# Patient Record
Sex: Female | Born: 1950 | Race: White | Hispanic: No | Smoking: Never smoker
Health system: Southern US, Community
[De-identification: ages and names within clinical notes are randomized; demographics above are authoritative.]

## PROBLEM LIST (undated history)

## (undated) DIAGNOSIS — E079 Disorder of thyroid, unspecified: Secondary | ICD-10-CM

## (undated) DIAGNOSIS — I10 Essential (primary) hypertension: Secondary | ICD-10-CM

## (undated) DIAGNOSIS — I639 Cerebral infarction, unspecified: Secondary | ICD-10-CM

## (undated) DIAGNOSIS — J45909 Unspecified asthma, uncomplicated: Secondary | ICD-10-CM

## (undated) DIAGNOSIS — I519 Heart disease, unspecified: Secondary | ICD-10-CM

## (undated) DIAGNOSIS — F32A Depression, unspecified: Secondary | ICD-10-CM

## (undated) DIAGNOSIS — J984 Other disorders of lung: Secondary | ICD-10-CM

## (undated) DIAGNOSIS — K219 Gastro-esophageal reflux disease without esophagitis: Secondary | ICD-10-CM

## (undated) DIAGNOSIS — F329 Major depressive disorder, single episode, unspecified: Secondary | ICD-10-CM

## (undated) HISTORY — DX: Disorder of thyroid, unspecified: E07.9

## (undated) HISTORY — DX: Heart disease, unspecified: I51.9

## (undated) HISTORY — DX: Other disorders of lung: J98.4

## (undated) HISTORY — DX: Cerebral infarction, unspecified: I63.9

## (undated) HISTORY — DX: Unspecified asthma, uncomplicated: J45.909

## (undated) HISTORY — DX: Gastro-esophageal reflux disease without esophagitis: K21.9

## (undated) HISTORY — DX: Essential (primary) hypertension: I10

## (undated) HISTORY — PX: OTHER SURGICAL HISTORY: SHX169

## (undated) HISTORY — DX: Depression, unspecified: F32.A

---
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
## 1998-11-17 ENCOUNTER — Inpatient Hospital Stay (HOSPITAL_COMMUNITY): Admission: EM | Admit: 1998-11-17 | Discharge: 1998-11-21 | Payer: Self-pay | Admitting: Emergency Medicine

## 1998-11-17 ENCOUNTER — Encounter: Payer: Self-pay | Admitting: *Deleted

## 2004-04-18 ENCOUNTER — Ambulatory Visit (HOSPITAL_COMMUNITY): Admission: RE | Admit: 2004-04-18 | Discharge: 2004-04-18 | Payer: Self-pay | Admitting: Cardiology

## 2006-06-19 ENCOUNTER — Inpatient Hospital Stay (HOSPITAL_COMMUNITY): Admission: RE | Admit: 2006-06-19 | Discharge: 2006-06-23 | Payer: Self-pay | Admitting: Urology

## 2010-09-08 ENCOUNTER — Encounter: Payer: Self-pay | Admitting: General Practice

## 2010-09-10 ENCOUNTER — Encounter: Payer: Self-pay | Admitting: General Practice

## 2010-12-25 ENCOUNTER — Other Ambulatory Visit: Payer: Self-pay | Admitting: Physician Assistant

## 2010-12-25 ENCOUNTER — Ambulatory Visit
Admission: RE | Admit: 2010-12-25 | Discharge: 2010-12-25 | Disposition: A | Discharge status: Home / Self Care | Payer: BC Managed Care – PPO | Source: Ambulatory Visit | Attending: Physician Assistant | Admitting: Physician Assistant

## 2010-12-25 DIAGNOSIS — R05 Cough: Secondary | ICD-10-CM

## 2012-02-12 IMAGING — CR DG CHEST 2V
2 series · 2 of 2 positions shown · non-contrast
Comparison: 06/16/2006

CLINICAL DATA: Cough.

CHEST - 2 VIEW

[view not recorded (1 of 2)]
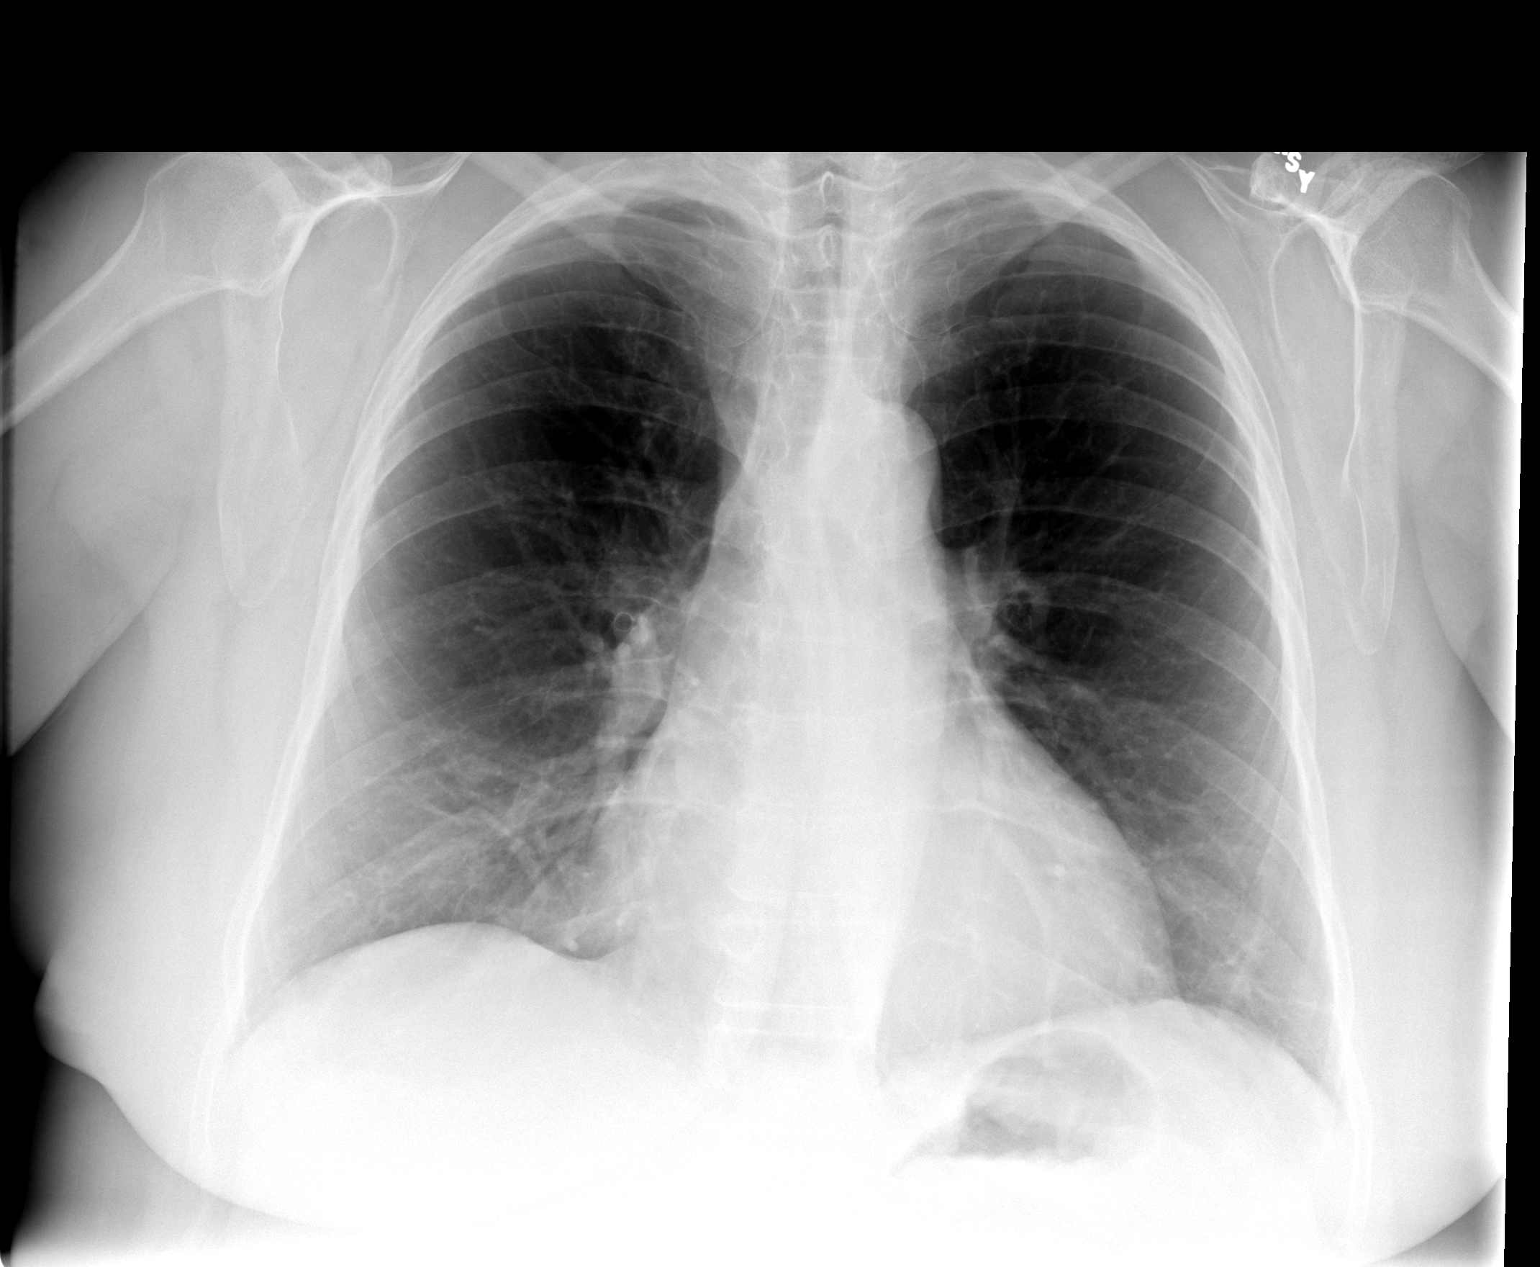

[view not recorded (2 of 2)]
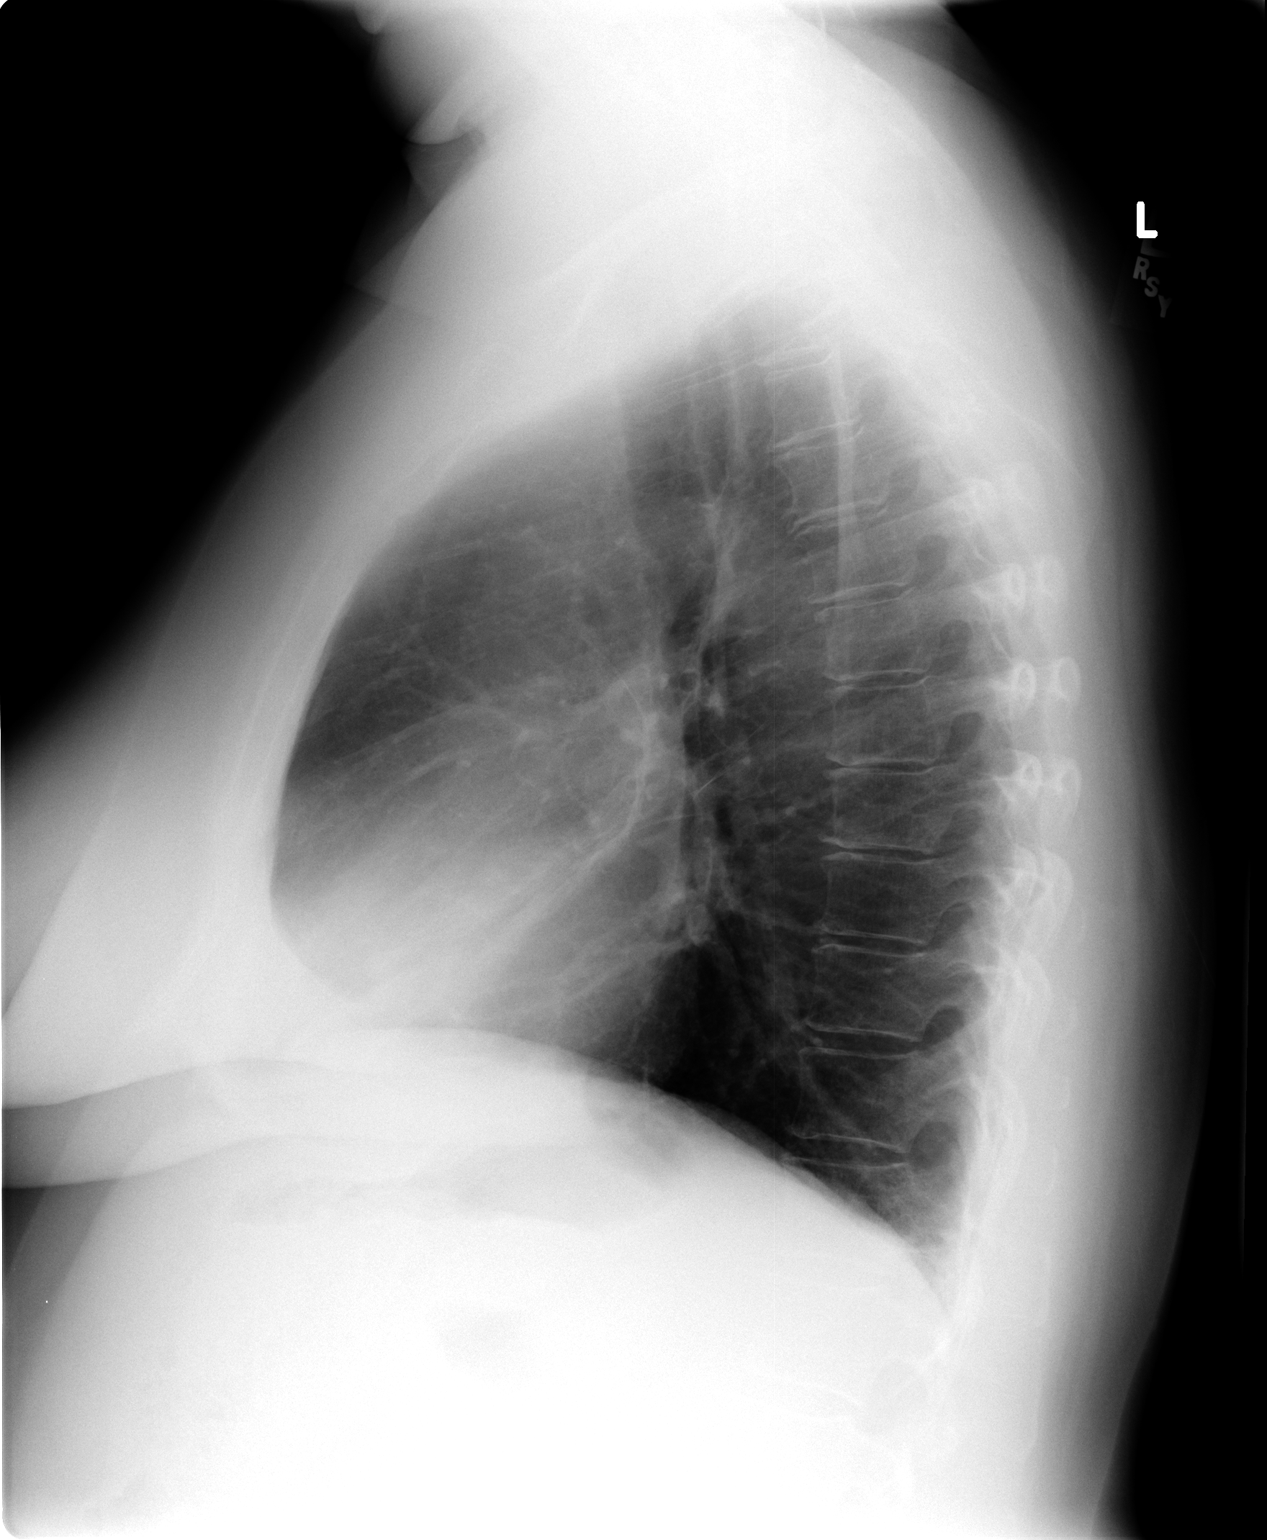

[2 of 2 positions shown; findings below may reference images not displayed]

FINDINGS: Heart and mediastinal contours are within normal limits.
No focal opacities or effusions.  No acute bony abnormality.
IMPRESSION: No active disease.

## 2014-08-19 HISTORY — PX: CARDIAC CATHETERIZATION: SHX172

## 2015-04-19 DIAGNOSIS — I214 Non-ST elevation (NSTEMI) myocardial infarction: Secondary | ICD-10-CM

## 2015-04-19 HISTORY — DX: Non-ST elevation (NSTEMI) myocardial infarction: I21.4

## 2015-05-09 DIAGNOSIS — I5181 Takotsubo syndrome: Secondary | ICD-10-CM | POA: Insufficient documentation

## 2015-05-09 HISTORY — DX: Takotsubo syndrome: I51.81

## 2015-07-05 DIAGNOSIS — I10 Essential (primary) hypertension: Secondary | ICD-10-CM | POA: Insufficient documentation

## 2015-07-05 HISTORY — DX: Essential (primary) hypertension: I10

## 2016-01-08 DIAGNOSIS — E039 Hypothyroidism, unspecified: Secondary | ICD-10-CM | POA: Insufficient documentation

## 2016-01-08 DIAGNOSIS — G4733 Obstructive sleep apnea (adult) (pediatric): Secondary | ICD-10-CM

## 2016-01-08 DIAGNOSIS — I2089 Other forms of angina pectoris: Secondary | ICD-10-CM

## 2016-01-08 DIAGNOSIS — E559 Vitamin D deficiency, unspecified: Secondary | ICD-10-CM | POA: Insufficient documentation

## 2016-01-08 DIAGNOSIS — E782 Mixed hyperlipidemia: Secondary | ICD-10-CM | POA: Insufficient documentation

## 2016-01-08 DIAGNOSIS — I208 Other forms of angina pectoris: Secondary | ICD-10-CM | POA: Insufficient documentation

## 2016-01-08 DIAGNOSIS — I129 Hypertensive chronic kidney disease with stage 1 through stage 4 chronic kidney disease, or unspecified chronic kidney disease: Secondary | ICD-10-CM

## 2016-01-08 DIAGNOSIS — G5603 Carpal tunnel syndrome, bilateral upper limbs: Secondary | ICD-10-CM | POA: Insufficient documentation

## 2016-01-08 DIAGNOSIS — N183 Chronic kidney disease, stage 3 unspecified: Secondary | ICD-10-CM

## 2016-01-08 DIAGNOSIS — J4520 Mild intermittent asthma, uncomplicated: Secondary | ICD-10-CM | POA: Insufficient documentation

## 2016-01-08 DIAGNOSIS — E1122 Type 2 diabetes mellitus with diabetic chronic kidney disease: Secondary | ICD-10-CM | POA: Insufficient documentation

## 2016-01-08 DIAGNOSIS — G43109 Migraine with aura, not intractable, without status migrainosus: Secondary | ICD-10-CM

## 2016-01-08 DIAGNOSIS — M8580 Other specified disorders of bone density and structure, unspecified site: Secondary | ICD-10-CM

## 2016-01-08 DIAGNOSIS — M5412 Radiculopathy, cervical region: Secondary | ICD-10-CM

## 2016-01-08 DIAGNOSIS — F330 Major depressive disorder, recurrent, mild: Secondary | ICD-10-CM | POA: Insufficient documentation

## 2016-01-08 HISTORY — DX: Radiculopathy, cervical region: M54.12

## 2016-01-08 HISTORY — DX: Mild intermittent asthma, uncomplicated: J45.20

## 2016-01-08 HISTORY — DX: Hypertensive chronic kidney disease with stage 1 through stage 4 chronic kidney disease, or unspecified chronic kidney disease: I12.9

## 2016-01-08 HISTORY — DX: Migraine with aura, not intractable, without status migrainosus: G43.109

## 2016-01-08 HISTORY — DX: Obstructive sleep apnea (adult) (pediatric): G47.33

## 2016-01-08 HISTORY — DX: Type 2 diabetes mellitus with diabetic chronic kidney disease: E11.22

## 2016-01-08 HISTORY — DX: Hypothyroidism, unspecified: E03.9

## 2016-01-08 HISTORY — DX: Mixed hyperlipidemia: E78.2

## 2016-01-08 HISTORY — DX: Vitamin D deficiency, unspecified: E55.9

## 2016-01-08 HISTORY — DX: Other forms of angina pectoris: I20.89

## 2016-01-08 HISTORY — DX: Chronic kidney disease, stage 3 unspecified: N18.30

## 2016-01-08 HISTORY — DX: Other specified disorders of bone density and structure, unspecified site: M85.80

## 2016-01-08 HISTORY — DX: Other forms of angina pectoris: I20.8

## 2016-01-08 HISTORY — DX: Carpal tunnel syndrome, bilateral upper limbs: G56.03

## 2016-01-11 DIAGNOSIS — F5101 Primary insomnia: Secondary | ICD-10-CM

## 2016-01-11 DIAGNOSIS — G2581 Restless legs syndrome: Secondary | ICD-10-CM | POA: Insufficient documentation

## 2016-01-11 HISTORY — DX: Primary insomnia: F51.01

## 2016-01-11 HISTORY — DX: Restless legs syndrome: G25.81

## 2018-08-19 HISTORY — PX: INSERTION OF CARDIAC MONITOR: SHX6657

## 2019-09-24 ENCOUNTER — Encounter: Payer: Self-pay | Admitting: Cardiology

## 2019-09-24 ENCOUNTER — Ambulatory Visit: Payer: Medicare HMO | Admitting: Cardiology

## 2019-09-24 ENCOUNTER — Other Ambulatory Visit: Payer: Self-pay

## 2019-09-24 VITALS — BP 130/76 | HR 72 | Ht 62.0 in | Wt 190.0 lb

## 2019-09-24 DIAGNOSIS — I5181 Takotsubo syndrome: Secondary | ICD-10-CM

## 2019-09-24 DIAGNOSIS — I639 Cerebral infarction, unspecified: Secondary | ICD-10-CM

## 2019-09-24 DIAGNOSIS — I10 Essential (primary) hypertension: Secondary | ICD-10-CM

## 2019-09-24 DIAGNOSIS — Z95818 Presence of other cardiac implants and grafts: Secondary | ICD-10-CM | POA: Insufficient documentation

## 2019-09-24 DIAGNOSIS — G2581 Restless legs syndrome: Secondary | ICD-10-CM

## 2019-09-24 DIAGNOSIS — Z9889 Other specified postprocedural states: Secondary | ICD-10-CM

## 2019-09-24 HISTORY — DX: Cerebral infarction, unspecified: I63.9

## 2019-09-24 HISTORY — DX: Presence of other cardiac implants and grafts: Z95.818

## 2019-09-24 NOTE — Patient Instructions (Signed)
Medication Instructions:  Your physician recommends that you continue on your current medications as directed. Please refer to the Current Medication list given to you today.  *If you need a refill on your cardiac medications before your next appointment, please call your pharmacy*  Lab Work: None.  If you have labs (blood work) drawn today and your tests are completely normal, you will receive your results only by: Marland Kitchen MyChart Message (if you have MyChart) OR . A paper copy in the mail If you have any lab test that is abnormal or we need to change your treatment, we will call you to review the results.  Testing/Procedures: None.   Follow-Up: At [facility], you and your health needs are our priority.  As part of our continuing mission to provide you with exceptional heart care, we have created designated Provider Care Teams.  These Care Teams include your primary Cardiologist (physician) and Advanced Practice Providers (APPs -  Physician Assistants and Nurse Practitioners) who all work together to provide you with the care you need, when you need it.  Your next appointment:   6 week(s)  The format for your next appointment:   In Person  Provider:   Gypsy Balsam, MD  Other Instructions   Dr. Bing Matter has referred you to see Dr. Elberta Fortis with electrophysiology for your loop recorder.

## 2019-09-24 NOTE — Progress Notes (Signed)
Cardiology Consultation:    Date:  09/24/2019   ID:  Wendy Valenzuela, DOB March 12, 1951, MRN [id number]  PCP:  Dema Severin, NP  Cardiologist:  Gypsy Balsam, MD   Referring MD: Dema Severin, NP   Chief Complaint  Patient presents with  . New Patient (Initial Visit)    History of Present Illness:    Wendy Valenzuela is a 69 y.o. female who is being seen today for the evaluation of Takotsubo cardiomyopathy at the request of York, Regina F, NP.  She recently relocated from [place].  She is to leave the [place] and she relocated back to [place] because her husband does have a terminal pancreatic cancer and he wants to be back at home in [place].  Apparently she had for cardiac catheterization in her life I do see documentation of cardiac catheterization done in [facility] in 2017 cardiac catheterization showed normal coronaries just diminished left ventricle ejection fraction.  Since that time she got 2 more cardiac catheterization every single time according to her she had normal coronaries.  At the end of last year she ended going to hospital in [place] where she was diagnosed with cryptogenic stroke.  Quite extensive evaluation has been done according to her and nothing has been identified.  He did receive a implantable loop recorder at that time. She would like to be establish as a patient in our practice. She is very stressed out about situation at home and obviously about her husband to be married to get it for 50 years.  He is in hospice already.  She described fatigue tiredness and being exhausted.  There is no swelling of lower extremities there is no proximal nocturnal dyspnea.  There is no palpitations no dizziness no passing out.  Denies having any typical chest pain described to have some sharp stress pinches-like sensation in the left side of her chest lasting only for split-second not related to exercise.  Past Medical History:  Diagnosis Date  .  Asthma   . Depression   . GERD (gastroesophageal reflux disease)   . Heart disease   . Hypertension   . Lung disease    Respiratory Failure  . Stroke (HCC)   . Thyroid disorder     Past Surgical History:  Procedure Laterality Date  . CARDIAC CATHETERIZATION  2016   [facility]   . DG  BONE DENSITY [facility] HX)  2007  . DIAGNOSTIC MAMMOGRAM Bilateral 2004  . INSERTION OF CARDIAC MONITOR  2020   This was done in [place]     Current Medications: Current Meds  Medication Sig  . ADVAIR DISKUS 500-50 MCG/DOSE AEPB Inhale 1 puff into the lungs 2 (two) times daily. Inhale 1 dose by mouth twice daily  . albuterol (VENTOLIN HFA) 108 (90 Base) MCG/ACT inhaler Inhale 2 puffs into the lungs every 6 (six) hours as needed for wheezing or shortness of breath.  Marland Kitchen amLODipine (NORVASC) 2.5 MG tablet Take 2.5 mg by mouth daily. Take 1 tablet once daily.  . clopidogrel (PLAVIX) 75 MG tablet Take 75 mg by mouth daily. Take 1 tablet once daily.  Marland Kitchen escitalopram (LEXAPRO) 20 MG tablet Take 20 mg by mouth daily.  . Ferrous Sulfate (IRON PO) Take 5,000 Units by mouth daily.  . furosemide (LASIX) 20 MG tablet Take 20 mg by mouth daily. Take 1 tablet once daily.  Marland Kitchen gabapentin (NEURONTIN) 300 MG capsule Take 300 mg by mouth daily. Take 1  tablet daily at bedtime.  . hydrochlorothiazide (HYDRODIURIL) 25 MG tablet Take 25 mg by mouth daily. Take 1 tablet once daily.   . isosorbide mononitrate (IMDUR) 60 MG 24 hr tablet Take 60 mg by mouth daily.  Marland Kitchen levothyroxine (SYNTHROID) 50 MCG tablet Take 50 mcg by mouth daily. Take 1 tablet once daily.  Marland Kitchen LOSARTAN POTASSIUM PO Take 100 mg by mouth daily.  . montelukast (SINGULAIR) 10 MG tablet Take 10 mg by mouth daily. Take 1 tablet once daily  . nitroGLYCERIN (NITROSTAT) 0.4 MG SL tablet Place 0.4 mg under the tongue every 5 (five) minutes as needed for chest pain.  Marland Kitchen omeprazole (PRILOSEC) 40 MG capsule Take 40 mg by mouth daily. Take 1 capsule by mouth daily.  Marland Kitchen  rOPINIRole (REQUIP) 1 MG tablet Take 0.25 mg by mouth daily. Take 1 tablet once daily.  . rosuvastatin (CRESTOR) 20 MG tablet Take 20 mg by mouth daily. Take 1 tablet in the evening.  . traZODone (DESYREL) 100 MG tablet Take 100 mg by mouth daily. Take 1-2 tablets as needed at bedtime.  . vitamin B-12 (CYANOCOBALAMIN) 1000 MCG tablet Take 1,000 mcg by mouth daily.  Marland Kitchen vortioxetine HBr (TRINTELLIX) 5 MG TABS tablet Take 5 mg by mouth daily.  . [DISCONTINUED] losartan-hydrochlorothiazide (HYZAAR) 100-25 MG tablet Take 1 tablet by mouth daily.     Allergies:   Aspirin and Sulfa antibiotics   Social History   Socioeconomic History  . Marital status: Married    Spouse name: Not on file  . Number of children: Not on file  . Years of education: Not on file  . Highest education level: Not on file  Occupational History  . Not on file  Tobacco Use  . Smoking status: Never Smoker  . Smokeless tobacco: Never Used  Substance and Sexual Activity  . Alcohol use: Not on file  . Drug use: Not on file  . Sexual activity: Not on file  Other Topics Concern  . Not on file  Social History Narrative  . Not on file   Social Determinants of Health   Financial Resource Strain:   . Difficulty of Paying Living Expenses: Not on file  Food Insecurity:   . Worried About Charity fundraiser in the Last Year: Not on file  . Ran Out of Food in the Last Year: Not on file  Transportation Needs:   . Lack of Transportation (Medical): Not on file  . Lack of Transportation (Non-Medical): Not on file  Physical Activity:   . Days of Exercise per Week: Not on file  . Minutes of Exercise per Session: Not on file  Stress:   . Feeling of Stress : Not on file  Social Connections:   . Frequency of Communication with Friends and Family: Not on file  . Frequency of Social Gatherings with Friends and Family: Not on file  . Attends Religious Services: Not on file  . Active Member of Clubs or Organizations: Not on file   . Attends Archivist Meetings: Not on file  . Marital Status: Not on file     Family History: The patient's family history includes Cancer in her mother; Hypertension in her father; Thyroid disease in her father. ROS:   Please see the history of present illness.    All 14 point review of systems negative except as described per history of present illness.  EKGs/Labs/Other Studies Reviewed:    The following studies were reviewed today: Laboratory test from her primary  care physician were reviewed.  CBC was normal, hemoglobin A1c was 5.7, her liver function tests were normal, lipid panel LDL 160 HDL 40 triglycerides 453 LDL 160.  I also reviewed the medical record from her primary care physician for this visit.  EKG:  EKG is  ordered today.  The ekg ordered today demonstrates EKG showed normal sinus rhythm, incomplete right bundle branch block, left anterior fascicular block, nonspecific ST segment changes  Recent Labs: No results found for requested labs within last 8760 hours.  Recent Lipid Panel No results found for: CHOL, TRIG, HDL, CHOLHDL, VLDL, LDLCALC, LDLDIRECT  Physical Exam:    VS:  BP 130/76   Pulse 72   Ht 5\' 2"  (1.575 m)   Wt 190 lb (86.2 kg)   SpO2 90%   BMI 34.75 kg/m     Wt Readings from Last 3 Encounters:  09/24/19 190 lb (86.2 kg)     GEN:  Well nourished, well developed in no acute distress HEENT: Normal NECK: No JVD; No carotid bruits LYMPHATICS: No lymphadenopathy CARDIAC: RRR, no murmurs, no rubs, no gallops RESPIRATORY:  Clear to auscultation without rales, wheezing or rhonchi  ABDOMEN: Soft, non-tender, non-distended MUSCULOSKELETAL:  No edema; No deformity  SKIN: Warm and dry NEUROLOGIC:  Alert and oriented x 3 PSYCHIATRIC:  Normal affect   ASSESSMENT:    1. History of loop recorder   2. Cryptogenic stroke (HCC)   3. Status post placement of implantable loop recorder   4. Essential hypertension   5. Restless legs syndrome  (RLS)   6. Takotsubo syndrome    PLAN:    In order of problems listed above:  1. History of loop recorder implantation.  I will enroll her in our EP clinic for follow-up. 2. History of cryptogenic stroke.  In my opinion she can benefit from statin.  I will continue conversation with her about starting statin.  She is already on Plavix as well as aspirin I will continue. 3. History of Takotsubo.  I will obtain records from her cardiologist from 11/22/19 she is telling me that she did have echocardiogram done recently. 4. Essential hypertension blood pressure well controlled  We did talk about healthy lifestyle but obviously now when she is taking care of her husband who is critically sick she does not have time to take care of herself.  Therefore, I will see her back in my office in about 6 weeks and we will revisit on this issue at that time we will talk more about taking cholesterol medication as well as healthy lifestyle.   Medication Adjustments/Labs and Tests Ordered: Current medicines are reviewed at length with the patient today.  Concerns regarding medicines are outlined above.  Orders Placed This Encounter  Procedures  . Ambulatory referral to Cardiac Electrophysiology  . EKG 12-Lead   No orders of the defined types were placed in this encounter.   Signed, [place], MD, [facility]. 09/24/2019 4:17 PM    [facility]

## 2019-11-18 ENCOUNTER — Ambulatory Visit: Payer: Medicare HMO | Admitting: Cardiology

## 2019-11-18 ENCOUNTER — Encounter: Payer: Self-pay | Admitting: Cardiology

## 2019-11-18 ENCOUNTER — Other Ambulatory Visit: Payer: Self-pay

## 2019-11-18 VITALS — BP 142/88 | HR 77 | Temp 98.1°F | Ht 62.0 in | Wt 181.4 lb

## 2019-11-18 DIAGNOSIS — I208 Other forms of angina pectoris: Secondary | ICD-10-CM

## 2019-11-18 DIAGNOSIS — I5181 Takotsubo syndrome: Secondary | ICD-10-CM

## 2019-11-18 DIAGNOSIS — I10 Essential (primary) hypertension: Secondary | ICD-10-CM

## 2019-11-18 DIAGNOSIS — I639 Cerebral infarction, unspecified: Secondary | ICD-10-CM | POA: Diagnosis not present

## 2019-11-18 NOTE — Patient Instructions (Signed)
Medication Instructions:  Your physician recommends that you continue on your current medications as directed. Please refer to the Current Medication list given to you today.  *If you need a refill on your cardiac medications before your next appointment, please call your pharmacy*   Lab Work: None ordered  If you have labs (blood work) drawn today and your tests are completely normal, you will receive your results only by: . MyChart Message (if you have MyChart) OR . A paper copy in the mail If you have any lab test that is abnormal or we need to change your treatment, we will call you to review the results.   Testing/Procedures: None ordered    Follow-Up: At [facility], you and your health needs are our priority.  As part of our continuing mission to provide you with exceptional heart care, we have created designated Provider Care Teams.  These Care Teams include your primary Cardiologist (physician) and Advanced Practice Providers (APPs -  Physician Assistants and Nurse Practitioners) who all work together to provide you with the care you need, when you need it.  We recommend signing up for the patient portal called "MyChart".  Sign up information is provided on this After Visit Summary.  MyChart is used to connect with patients for Virtual Visits (Telemedicine).  Patients are able to view lab/test results, encounter notes, upcoming appointments, etc.  Non-urgent messages can be sent to your provider as well.   To learn more about what you can do with MyChart, go to [email or web address].    Your next appointment:   2 month(s)  The format for your next appointment:   In Person  Provider:   Robert Krasowski, MD   Other Instructions None   

## 2019-11-18 NOTE — Progress Notes (Signed)
Cardiology Office Note:    Date:  11/18/2019   ID:  Wendy Valenzuela, DOB [birth year], MRN [id number]  PCP:  Dema Severin, NP  Cardiologist:  Gypsy Balsam, MD    Referring MD: Dema Severin, NP   No chief complaint on file. I am grieving after my husband passing  History of Present Illness:    Wendy Valenzuela is a 69 y.o. female   She recently relocated from [place].  She is to leave the [place] and she relocated back to [place] because her husband does have a terminal pancreatic cancer and he wants to be back at home in [place].  Apparently she had for cardiac catheterization in her life I do see documentation of cardiac catheterization done in [facility] in 2017 cardiac catheterization showed normal coronaries just diminished left ventricle ejection fraction.  Since that time she got 2 more cardiac catheterization every single time according to her she had normal coronaries.  At the end of last year she ended going to hospital in [place] where she was diagnosed with cryptogenic stroke.  Quite extensive evaluation has been done according to her and nothing has been identified.  He did receive a implantable loop recorder at that time. She wanted to be established with our practice consultation.  The reason why she moved back to localize the fact that her husband of 50 years was dying because of pancreatic cancer.  He passed on November 06, 2022.  Obviously she is very sad about it. Cardiac wise seems to be doing well.  Denies having a chest pain, tightness, pressure, burning the chest.  She is asking me if we can reduce any of her medications.  I told her now with this difficult time for her not to do it.  I will see her back in my office in about 2 months and at that time we will decide what to do about her medications.  She will most likely require echocardiogram as well as stress testing.  However, now I would simply follow her up with the present medications.  Past  Medical History:  Diagnosis Date  . Asthma   . Depression   . GERD (gastroesophageal reflux disease)   . Heart disease   . Hypertension   . Lung disease    Respiratory Failure  . Stroke (HCC)   . Thyroid disorder     Past Surgical History:  Procedure Laterality Date  . CARDIAC CATHETERIZATION  2016   [facility]   . DG  BONE DENSITY [facility] HX)  2007  . DIAGNOSTIC MAMMOGRAM Bilateral 2004  . INSERTION OF CARDIAC MONITOR  2020   This was done in [place]     Current Medications: Current Meds  Medication Sig  . ADVAIR DISKUS 500-50 MCG/DOSE AEPB Inhale 1 puff into the lungs 2 (two) times daily. Inhale 1 dose by mouth twice daily  . albuterol (VENTOLIN HFA) 108 (90 Base) MCG/ACT inhaler Inhale 2 puffs into the lungs every 6 (six) hours as needed for wheezing or shortness of breath.  Marland Kitchen amLODipine (NORVASC) 2.5 MG tablet Take 2.5 mg by mouth daily. Take 1 tablet once daily.  Marland Kitchen atorvastatin (LIPITOR) 20 MG tablet Take 20 mg by mouth daily.  . clopidogrel (PLAVIX) 75 MG tablet Take 75 mg by mouth daily. Take 1 tablet once daily.  Marland Kitchen escitalopram (LEXAPRO) 20 MG tablet Take 20 mg by mouth daily.  . Ferrous Sulfate (IRON PO) Take 5,000 Units by mouth  daily.  . furosemide (LASIX) 20 MG tablet Take 20 mg by mouth daily. Take 1 tablet once daily.  Marland Kitchen gabapentin (NEURONTIN) 300 MG capsule Take 300 mg by mouth daily. Take 1 tablet daily at bedtime.  . hydrochlorothiazide (HYDRODIURIL) 25 MG tablet Take 25 mg by mouth daily. Take 1 tablet once daily.   . isosorbide mononitrate (IMDUR) 60 MG 24 hr tablet Take 60 mg by mouth daily.  Marland Kitchen levothyroxine (SYNTHROID) 50 MCG tablet Take 50 mcg by mouth daily. Take 1 tablet once daily.  Marland Kitchen LOSARTAN POTASSIUM PO Take 100 mg by mouth daily.  . montelukast (SINGULAIR) 10 MG tablet Take 10 mg by mouth daily. Take 1 tablet once daily  . nitroGLYCERIN (NITROSTAT) 0.4 MG SL tablet Place 0.4 mg under the tongue every 5 (five) minutes as needed for chest pain.   Marland Kitchen omeprazole (PRILOSEC) 40 MG capsule Take 40 mg by mouth daily. Take 1 capsule by mouth daily.  Marland Kitchen rOPINIRole (REQUIP) 1 MG tablet Take 0.25 mg by mouth daily. Take 1 tablet once daily.  . rosuvastatin (CRESTOR) 20 MG tablet Take 20 mg by mouth daily. Take 1 tablet in the evening.  . temazepam (RESTORIL) 15 MG capsule Take 15 mg by mouth at bedtime.  . traZODone (DESYREL) 100 MG tablet Take 100 mg by mouth daily. Take 1-2 tablets as needed at bedtime.  . vitamin B-12 (CYANOCOBALAMIN) 1000 MCG tablet Take 1,000 mcg by mouth daily.  Marland Kitchen vortioxetine HBr (TRINTELLIX) 5 MG TABS tablet Take 5 mg by mouth daily.     Allergies:   Aspirin and Sulfa antibiotics   Social History   Socioeconomic History  . Marital status: Married    Spouse name: Not on file  . Number of children: Not on file  . Years of education: Not on file  . Highest education level: Not on file  Occupational History  . Not on file  Tobacco Use  . Smoking status: Never Smoker  . Smokeless tobacco: Never Used  Substance and Sexual Activity  . Alcohol use: Not on file  . Drug use: Not on file  . Sexual activity: Not on file  Other Topics Concern  . Not on file  Social History Narrative  . Not on file   Social Determinants of Health   Financial Resource Strain:   . Difficulty of Paying Living Expenses:   Food Insecurity:   . Worried About Programme researcher, broadcasting/film/video in the Last Year:   . Barista in the Last Year:   Transportation Needs:   . Freight forwarder (Medical):   Marland Kitchen Lack of Transportation (Non-Medical):   Physical Activity:   . Days of Exercise per Week:   . Minutes of Exercise per Session:   Stress:   . Feeling of Stress :   Social Connections:   . Frequency of Communication with Friends and Family:   . Frequency of Social Gatherings with Friends and Family:   . Attends Religious Services:   . Active Member of Clubs or Organizations:   . Attends Banker Meetings:   Marland Kitchen Marital  Status:      Family History: The patient's family history includes Cancer in her mother; Hypertension in her father; Thyroid disease in her father. ROS:   Please see the history of present illness.    All 14 point review of systems negative except as described per history of present illness  EKGs/Labs/Other Studies Reviewed:      Recent Labs: No  results found for requested labs within last 8760 hours.  Recent Lipid Panel No results found for: CHOL, TRIG, HDL, CHOLHDL, VLDL, LDLCALC, LDLDIRECT  Physical Exam:    VS:  BP (!) 142/88   Pulse 77   Temp 98.1 F (36.7 C)   Ht 5\' 2"  (1.575 m)   Wt 181 lb 6.4 oz (82.3 kg)   SpO2 97%   BMI 33.18 kg/m     Wt Readings from Last 3 Encounters:  11/18/19 181 lb 6.4 oz (82.3 kg)  09/24/19 190 lb (86.2 kg)     GEN:  Well nourished, well developed in no acute distress HEENT: Normal NECK: No JVD; No carotid bruits LYMPHATICS: No lymphadenopathy CARDIAC: RRR, no murmurs, no rubs, no gallops RESPIRATORY:  Clear to auscultation without rales, wheezing or rhonchi  ABDOMEN: Soft, non-tender, non-distended MUSCULOSKELETAL:  No edema; No deformity  SKIN: Warm and dry LOWER EXTREMITIES: no swelling NEUROLOGIC:  Alert and oriented x 3 PSYCHIATRIC:  Normal affect   ASSESSMENT:    1. Chronic stable angina ([place])   2. Cryptogenic stroke ([place])   3. Essential hypertension   4. Takotsubo syndrome    PLAN:    In order of problems listed above:  1. Chronic stable angina pectoris.  She tells me that every single time she did cardiac catheterization showed normal coronaries.  We did not receive any records from her previous cardiologist.  We will attempt to get records again.  In the meantime we will continue risk factors modifications.  Luckily she is asymptomatic. 2. Cryptogenic stroke.  She does have implantable loop recorder.  She is scheduled to follow-up with EP team to be establish as a patient so can follow-up of her  recorder. 3. Essential hypertension.  Blood pressure seems to be slightly elevated today but again this is a very stressful time for her.  I will wait for about 2 to 3 months before I see her again and adjustment to her medication will be made at that time. 4. Remote history of Takotsubo.  Supposedly normalization of left ventricle ejection fraction after that.  Will schedule her in the future to have an echocardiogram.   Medication Adjustments/Labs and Tests Ordered: Current medicines are reviewed at length with the patient today.  Concerns regarding medicines are outlined above.  No orders of the defined types were placed in this encounter.  Medication changes: No orders of the defined types were placed in this encounter.   Signed, [place], MD, [facility] 11/18/2019 2:53 PM    [place]

## 2019-11-21 NOTE — Progress Notes (Signed)
Electrophysiology Office Note   Date:  11/22/2019   ID:  Wendy Valenzuela, DOB October 10, 1950, MRN [id number]  PCP:  Imagene Riches, NP  Cardiologist:  Agustin Cree Primary Electrophysiologist:  Constance Haw, MD    Chief Complaint: Wendy Valenzuela monitor   History of Present Illness: Wendy Valenzuela is a 69 y.o. female who is being seen today for the evaluation of LINQ monitor at the request of Wendy Liter, MD. Presenting today for electrophysiology evaluation.  She has a history of hypertension, CVA.  She has apparently had multiple catheterizations that have shown no evidence of coronary artery disease.  There is a report of a reduced ejection fraction, but no echo has been performed.  At the end of last year, she went to the hospital in [place] and was diagnosed with cryptogenic stroke.  She had a loop recorder implanted.  She moved to [place] with her husband of 27 years.  He wanted to move back to [place] from [place] as he was diagnosed with pancreatic cancer.  He died on November 25, 2022.  Today, she denies symptoms of palpitations, chest pain, shortness of breath, orthopnea, PND, lower extremity edema, claudication, dizziness, presyncope, syncope, bleeding, or neurologic sequela. The patient is tolerating medications without difficulties.    Past Medical History:  Diagnosis Date  . Asthma   . Depression   . GERD (gastroesophageal reflux disease)   . Heart disease   . Hypertension   . Lung disease    Respiratory Failure  . Stroke (Wendy Valenzuela)   . Thyroid disorder    Past Surgical History:  Procedure Laterality Date  . CARDIAC CATHETERIZATION  2016   [facility]   . DG  BONE DENSITY [facility] HX)  2007  . DIAGNOSTIC MAMMOGRAM Bilateral 2004  . INSERTION OF CARDIAC MONITOR  2020   This was done in [place]      Current Outpatient Medications  Medication Sig Dispense Refill  . ADVAIR DISKUS 500-50 MCG/DOSE AEPB Inhale 1 puff into the lungs 2 (two) times daily. Inhale 1 dose  by mouth twice daily    . albuterol (VENTOLIN HFA) 108 (90 Base) MCG/ACT inhaler Inhale 2 puffs into the lungs every 6 (six) hours as needed for wheezing or shortness of breath.    Marland Kitchen amLODipine (NORVASC) 2.5 MG tablet Take 2.5 mg by mouth daily. Take 1 tablet once daily.    Marland Kitchen atorvastatin (LIPITOR) 20 MG tablet Take 20 mg by mouth daily.    . clopidogrel (PLAVIX) 75 MG tablet Take 75 mg by mouth daily. Take 1 tablet once daily.    Marland Kitchen escitalopram (LEXAPRO) 20 MG tablet Take 20 mg by mouth daily.    . Ferrous Sulfate (IRON PO) Take 5,000 Units by mouth daily.    . furosemide (LASIX) 20 MG tablet Take 20 mg by mouth daily. Take 1 tablet once daily.    Marland Kitchen gabapentin (NEURONTIN) 300 MG capsule Take 300 mg by mouth daily. Take 1 tablet daily at bedtime.    . hydrochlorothiazide (HYDRODIURIL) 25 MG tablet Take 25 mg by mouth daily. Take 1 tablet once daily.     . isosorbide mononitrate (IMDUR) 60 MG 24 hr tablet Take 60 mg by mouth daily.    Marland Kitchen levothyroxine (SYNTHROID) 50 MCG tablet Take 50 mcg by mouth daily. Take 1 tablet once daily.    Marland Kitchen LOSARTAN POTASSIUM PO Take 100 mg by mouth daily.    . montelukast (SINGULAIR) 10 MG tablet Take 10 mg by  mouth daily. Take 1 tablet once daily    . nitroGLYCERIN (NITROSTAT) 0.4 MG SL tablet Place 0.4 mg under the tongue every 5 (five) minutes as needed for chest pain.    Marland Kitchen omeprazole (PRILOSEC) 40 MG capsule Take 40 mg by mouth daily. Take 1 capsule by mouth daily.    Marland Kitchen rOPINIRole (REQUIP) 1 MG tablet Take 0.25 mg by mouth daily. Take 1 tablet once daily.    . rosuvastatin (CRESTOR) 20 MG tablet Take 20 mg by mouth daily. Take 1 tablet in the evening.    . temazepam (RESTORIL) 15 MG capsule Take 15 mg by mouth at bedtime.    . traZODone (DESYREL) 100 MG tablet Take 100 mg by mouth daily. Take 1-2 tablets as needed at bedtime.    . TRINTELLIX 20 MG TABS tablet Take 20 mg by mouth daily.    . vitamin B-12 (CYANOCOBALAMIN) 1000 MCG tablet Take 1,000 mcg by mouth  daily.     No current facility-administered medications for this visit.    Allergies:   Aspirin and Sulfa antibiotics   Social History:  The patient  reports that she has never smoked. She has never used smokeless tobacco.   Family History:  The patient's family history includes Cancer in her mother; Hypertension in her father; Thyroid disease in her father.    ROS:  Please see the history of present illness.   Otherwise, review of systems is positive for none.   All other systems are reviewed and negative.    PHYSICAL EXAM: VS:  BP 130/86   Pulse 70   Ht 5\' 1"  (1.549 m)   Wt 183 lb (83 kg)   SpO2 96%   BMI 34.58 kg/m  , BMI Body mass index is 34.58 kg/m. GEN: Well nourished, well developed, in no acute distress  HEENT: normal  Neck: no JVD, carotid bruits, or masses Cardiac: RRR; no murmurs, rubs, or gallops,no edema  Respiratory:  clear to auscultation bilaterally, normal work of breathing GI: soft, nontender, nondistended, + BS MS: no deformity or atrophy  Skin: warm and dry, device pocket is well healed Neuro:  Strength and sensation are intact Psych: euthymic mood, full affect  EKG:  EKG is not ordered today. Personal review of the ekg ordered 09/24/19 shows SR, LAFB  Device interrogation is reviewed today in detail.  See PaceArt for details.   Recent Labs: No results found for requested labs within last 8760 hours.    Lipid Panel  No results found for: CHOL, TRIG, HDL, CHOLHDL, VLDL, LDLCALC, LDLDIRECT   Wt Readings from Last 3 Encounters:  11/22/19 183 lb (83 kg)  11/18/19 181 lb 6.4 oz (82.3 kg)  09/24/19 190 lb (86.2 kg)      Other studies Reviewed: Additional studies/ records that were reviewed today include: Cardiology notes   ASSESSMENT AND PLAN:  1.  Cryptogenic stroke: Patient is status post Linq monitor implant.  Device functioning appropriately.  No atrial fibrillation seen.  We Wendy Valenzuela have her set up for remote monitoring.  2.   Hypertension: Currently well controlled  Current medicines are reviewed at length with the patient today.   The patient does not have concerns regarding her medicines.  The following changes were made today:  none  Labs/ tests ordered today include:  No orders of the defined types were placed in this encounter.    Disposition:   FU with Armin Yerger pending link monitor results  Signed, Keneth Borg 11/22/19, MD  11/22/2019 10:41  AM     [facility] [address] [place] [address] [phone] (office) [phone] (fax)

## 2019-11-22 ENCOUNTER — Encounter: Payer: Self-pay | Admitting: Cardiology

## 2019-11-22 ENCOUNTER — Ambulatory Visit (INDEPENDENT_AMBULATORY_CARE_PROVIDER_SITE_OTHER): Payer: Medicare HMO | Admitting: Cardiology

## 2019-11-22 ENCOUNTER — Other Ambulatory Visit: Payer: Self-pay

## 2019-11-22 VITALS — BP 130/86 | HR 70 | Ht 61.0 in | Wt 183.0 lb

## 2019-11-22 DIAGNOSIS — Z95818 Presence of other cardiac implants and grafts: Secondary | ICD-10-CM | POA: Diagnosis not present

## 2019-11-22 DIAGNOSIS — I639 Cerebral infarction, unspecified: Secondary | ICD-10-CM | POA: Diagnosis not present

## 2019-11-22 LAB — CUP PACEART INCLINIC DEVICE CHECK
Date Time Interrogation Session: 20210405104137
Implantable Pulse Generator Implant Date: 20201021

## 2019-11-22 NOTE — Patient Instructions (Addendum)
Medication Instructions:  Your physician recommends that you continue on your current medications as directed. Please refer to the Current Medication list given to you today.  *If you need a refill on your cardiac medications before your next appointment, please call your pharmacy*   Lab Work: None ordered   Testing/Procedures: None ordered   Follow-Up: At [facility], you and your health needs are our priority.  As part of our continuing mission to provide you with exceptional heart care, we have created designated Provider Care Teams.  These Care Teams include your primary Cardiologist (physician) and Advanced Practice Providers (APPs -  Physician Assistants and Nurse Practitioners) who all work together to provide you with the care you need, when you need it.  We recommend signing up for the patient portal called "MyChart".  Sign up information is provided on this After Visit Summary.  MyChart is used to connect with patients for Virtual Visits (Telemedicine).  Patients are able to view lab/test results, encounter notes, upcoming appointments, etc.  Non-urgent messages can be sent to your provider as well.   To learn more about what you can do with MyChart, go to [email or web address].    Your next appointment:    No follow up is needed with Dr. Elberta Fortis.  He will see you on an as needed basis.  We will monitor your loop recorder findings monthly.   Thank you for choosing [facility]!!   Dory Horn, RN [phone]    Other Instructions  Device clinic # [phone]

## 2019-11-30 ENCOUNTER — Telehealth: Payer: Self-pay

## 2019-11-30 NOTE — Telephone Encounter (Signed)
LMOVM for pt to send a manual transmission. Per [employer] the pt monitor sent an Alert for a pause episode.

## 2019-12-01 NOTE — Telephone Encounter (Signed)
Transmission received. I told her the nurse will review the transmission and if she see anything she will give her a call back.

## 2019-12-01 NOTE — Telephone Encounter (Signed)
Full report reviewed. All "pause" episodes previously reviewed as false at 11/22/19 clinic visit. Pause detection turned off at that time per Dr. Elberta Fortis. No new alerts or episodes noted.

## 2019-12-10 ENCOUNTER — Ambulatory Visit (INDEPENDENT_AMBULATORY_CARE_PROVIDER_SITE_OTHER): Payer: Medicare HMO | Admitting: *Deleted

## 2019-12-10 DIAGNOSIS — I639 Cerebral infarction, unspecified: Secondary | ICD-10-CM

## 2019-12-10 LAB — CUP PACEART REMOTE DEVICE CHECK
Date Time Interrogation Session: 20210423010822
Implantable Pulse Generator Implant Date: 20201021

## 2019-12-10 NOTE — Progress Notes (Signed)
ILR Remote 

## 2019-12-12 LAB — CUP PACEART REMOTE DEVICE CHECK
Date Time Interrogation Session: 20210424081005
Implantable Pulse Generator Implant Date: 20201021

## 2020-01-05 ENCOUNTER — Telehealth: Payer: Self-pay

## 2020-01-05 NOTE — Telephone Encounter (Signed)
Left message for patient to inform of disconnected monitor. 

## 2020-01-10 LAB — CUP PACEART REMOTE DEVICE CHECK
Date Time Interrogation Session: 20210524013313
Implantable Pulse Generator Implant Date: 20201021

## 2020-01-11 ENCOUNTER — Telehealth: Payer: Self-pay

## 2020-01-11 NOTE — Telephone Encounter (Signed)
LMOVM  ILR alert for AF episode, AF event detail not downloaded due to connection gap. Will need manual for further review of AF event. Routing to get manual transmission for further review. JBox, RN/CVRS

## 2020-01-12 ENCOUNTER — Ambulatory Visit (INDEPENDENT_AMBULATORY_CARE_PROVIDER_SITE_OTHER): Payer: Medicare HMO | Admitting: *Deleted

## 2020-01-12 ENCOUNTER — Other Ambulatory Visit: Payer: Self-pay

## 2020-01-12 DIAGNOSIS — I639 Cerebral infarction, unspecified: Secondary | ICD-10-CM

## 2020-01-12 NOTE — Progress Notes (Signed)
Carelink Summary Report / Loop Recorder 

## 2020-01-18 ENCOUNTER — Ambulatory Visit: Payer: Medicare HMO | Admitting: Cardiology

## 2020-01-18 ENCOUNTER — Other Ambulatory Visit: Payer: Self-pay

## 2020-01-18 ENCOUNTER — Encounter: Payer: Self-pay | Admitting: Cardiology

## 2020-01-18 VITALS — BP 132/86 | HR 67 | Ht 61.0 in | Wt 185.8 lb

## 2020-01-18 DIAGNOSIS — I10 Essential (primary) hypertension: Secondary | ICD-10-CM

## 2020-01-18 DIAGNOSIS — I639 Cerebral infarction, unspecified: Secondary | ICD-10-CM | POA: Diagnosis not present

## 2020-01-18 DIAGNOSIS — E782 Mixed hyperlipidemia: Secondary | ICD-10-CM

## 2020-01-18 DIAGNOSIS — I208 Other forms of angina pectoris: Secondary | ICD-10-CM

## 2020-01-18 NOTE — Progress Notes (Signed)
Cardiology Office Note:    Date:  01/18/2020   ID:  [name], DOB 05-21-1951, MRN [id number]  PCP:  Wendy Severin, NP  Cardiologist:  Gypsy Balsam, MD    Referring MD: Wendy Severin, NP   Chief Complaint  Patient presents with  . Follow-up    2 MO FU   Doing well  History of Present Illness:    Wendy Valenzuela is a 69 y.o. female  She recently relocated from [place]. She is to leave the [place] and she relocated back to [place] because her husband does have a terminal pancreatic cancer and he wants to be back at home in [place]. Apparently she had for cardiac catheterization in her life I do see documentation of cardiac catheterization done in [facility] in 2017 cardiac catheterization showed normal coronaries just diminished left ventricle ejection fraction. Since that time she got 2 more cardiac catheterization every single time according to her she had normal coronaries. At the end of last year she ended going to hospital in [place] where she was diagnosed with cryptogenic stroke. Quite extensive evaluation has been done according to her and nothing has been identified. He did receive a implantable loop recorder at that time. Comes today to my office for follow-up.  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  She wanted me to cut down some of her medication and today's visit this discussion regarding that issue.  Overall she says she is doing well and she described to have some shortness of breath the same as she has been having for last few years.  No chest pain tightness squeezing pressure burning chest.  Past Medical History:  Diagnosis Date  . Asthma   . Depression   . GERD (gastroesophageal reflux disease)   . Heart disease   . Hypertension   . Lung disease    Respiratory Failure  . Stroke (HCC)   . Thyroid disorder     Past Surgical History:   Procedure Laterality Date  . CARDIAC CATHETERIZATION  2016   [facility]   . DG  BONE DENSITY [facility] HX)  2007  . DIAGNOSTIC MAMMOGRAM Bilateral 2004  . INSERTION OF CARDIAC MONITOR  2020   This was done in [place]     Current Medications: Current Meds  Medication Sig  . ADVAIR DISKUS 500-50 MCG/DOSE AEPB Inhale 1 puff into the lungs 2 (two) times daily. Inhale 1 dose by mouth twice daily  . albuterol (VENTOLIN HFA) 108 (90 Base) MCG/ACT inhaler Inhale 2 puffs into the lungs every 6 (six) hours as needed for wheezing or shortness of breath.  Marland Kitchen amLODipine (NORVASC) 2.5 MG tablet Take 2.5 mg by mouth daily. Take 1 tablet once daily.  Marland Kitchen atorvastatin (LIPITOR) 20 MG tablet Take 20 mg by mouth daily.  . clopidogrel (PLAVIX) 75 MG tablet Take 75 mg by mouth daily. Take 1 tablet once daily.  . furosemide (LASIX) 20 MG tablet Take 20 mg by mouth daily. Take 1 tablet once daily.  Marland Kitchen gabapentin (NEURONTIN) 300 MG capsule Take 300 mg by mouth daily. Take 1 tablet daily at bedtime.  . hydrochlorothiazide (HYDRODIURIL) 25 MG tablet Take 25 mg by mouth daily. Take 1 tablet once daily.   . hydrOXYzine (ATARAX/VISTARIL) 25 MG tablet Take 1 tablet by mouth daily.  . isosorbide mononitrate (IMDUR) 60 MG 24 hr tablet Take 60  mg by mouth daily.  Marland Kitchen levothyroxine (SYNTHROID) 50 MCG tablet Take 50 mcg by mouth daily. Take 1 tablet once daily.  Marland Kitchen LOSARTAN POTASSIUM PO Take 100 mg by mouth daily.  . montelukast (SINGULAIR) 10 MG tablet Take 10 mg by mouth daily. Take 1 tablet once daily  . nitroGLYCERIN (NITROSTAT) 0.4 MG SL tablet Place 0.4 mg under the tongue every 5 (five) minutes as needed for chest pain.  Marland Kitchen omeprazole (PRILOSEC) 40 MG capsule Take 40 mg by mouth daily. Take 1 capsule by mouth daily.  . ondansetron (ZOFRAN) 4 MG tablet Take 1 tablet by mouth as needed.  . rosuvastatin (CRESTOR) 20 MG tablet Take 20 mg by mouth daily. Take 1 tablet in the evening.  . temazepam (RESTORIL) 15 MG capsule  Take 15 mg by mouth at bedtime.  . TRINTELLIX 20 MG TABS tablet Take 20 mg by mouth daily.     Allergies:   Aspirin and Sulfa antibiotics   Social History   Socioeconomic History  . Marital status: Married    Spouse name: Not on file  . Number of children: Not on file  . Years of education: Not on file  . Highest education level: Not on file  Occupational History  . Not on file  Tobacco Use  . Smoking status: Never Smoker  . Smokeless tobacco: Never Used  Substance and Sexual Activity  . Alcohol use: Not on file  . Drug use: Not on file  . Sexual activity: Not on file  Other Topics Concern  . Not on file  Social History Narrative  . Not on file   Social Determinants of Health   Financial Resource Strain:   . Difficulty of Paying Living Expenses:   Food Insecurity:   . Worried About Charity fundraiser in the Last Year:   . Arboriculturist in the Last Year:   Transportation Needs:   . Film/video editor (Medical):   Marland Kitchen Lack of Transportation (Non-Medical):   Physical Activity:   . Days of Exercise per Week:   . Minutes of Exercise per Session:   Stress:   . Feeling of Stress :   Social Connections:   . Frequency of Communication with Friends and Family:   . Frequency of Social Gatherings with Friends and Family:   . Attends Religious Services:   . Active Member of Clubs or Organizations:   . Attends Archivist Meetings:   Marland Kitchen Marital Status:      Family History: The patient's family history includes Cancer in her mother; Hypertension in her father; Thyroid disease in her father. ROS:   Please see the history of present illness.    All 14 point review of systems negative except as described per history of present illness  EKGs/Labs/Other Studies Reviewed:      Recent Labs: No results found for requested labs within last 8760 hours.  Recent Lipid Panel No results found for: CHOL, TRIG, HDL, CHOLHDL, VLDL, LDLCALC, LDLDIRECT  Physical Exam:     VS:  BP 132/86   Pulse 67   Ht 5\' 1"  (1.549 m)   Wt 185 lb 12.8 oz (84.3 kg)   SpO2 90%   BMI 35.11 kg/m     Wt Readings from Last 3 Encounters:  01/18/20 185 lb 12.8 oz (84.3 kg)  11/22/19 183 lb (83 kg)  11/18/19 181 lb 6.4 oz (82.3 kg)     GEN:  Well nourished, well developed in no acute distress  HEENT: Normal NECK: No JVD; No carotid bruits LYMPHATICS: No lymphadenopathy CARDIAC: RRR, no murmurs, no rubs, no gallops RESPIRATORY:  Clear to auscultation without rales, wheezing or rhonchi  ABDOMEN: Soft, non-tender, non-distended MUSCULOSKELETAL:  No edema; No deformity  SKIN: Warm and dry LOWER EXTREMITIES: no swelling NEUROLOGIC:  Alert and oriented x 3 PSYCHIATRIC:  Normal affect   ASSESSMENT:    1. Chronic stable angina (HCC)   2. Cryptogenic stroke (HCC)   3. Essential hypertension   4. Mixed hyperlipidemia    PLAN:    In order of problems listed above:  1. Chronic stable angina pectoris managed with medications cardiac catheterization repeatedly showing normal coronaries without obstruction.  Still she got symptomatology suggest coronary artery disease probably we may be dealing with small vessel disease.  Stable 2. Cryptogenic stroke: Implantable loop recorder in place.  So far no arrhythmia identified. 3. Essential hypertension blood pressure well controlled continue present management.  I will ask you to have an echocardiogram done to assess left ventricle ejection fraction.  Based on that we will decide how we can simplify her medical management in the future we may consider discontinuation of amlodipine since she is taking only 2.5 mg as well as discontinuation either hydrochlorothiazide or furosemide. 4. Mixed dyslipidemia: We will call primary care physician to get her fasting blood profile.   Medication Adjustments/Labs and Tests Ordered: Current medicines are reviewed at length with the patient today.  Concerns regarding medicines are outlined above.   No orders of the defined types were placed in this encounter.  Medication changes: No orders of the defined types were placed in this encounter.   Signed, Georgeanna Lea, MD, [facility] 01/18/2020 1:31 PM    [facility]

## 2020-01-18 NOTE — Patient Instructions (Signed)
Medication Instructions:  Your physician recommends that you continue on your current medications as directed. Please refer to the Current Medication list given to you today.  *If you need a refill on your cardiac medications before your next appointment, please call your pharmacy*   Lab Work: None. If you have labs (blood work) drawn today and your tests are completely normal, you will receive your results only by: . MyChart Message (if you have MyChart) OR . A paper copy in the mail If you have any lab test that is abnormal or we need to change your treatment, we will call you to review the results.   Testing/Procedures: Your physician has requested that you have an echocardiogram. Echocardiography is a painless test that uses sound waves to create images of your heart. It provides your doctor with information about the size and shape of your heart and how well your heart's chambers and valves are working. This procedure takes approximately one hour. There are no restrictions for this procedure.     Follow-Up: At [facility], you and your health needs are our priority.  As part of our continuing mission to provide you with exceptional heart care, we have created designated Provider Care Teams.  These Care Teams include your primary Cardiologist (physician) and Advanced Practice Providers (APPs -  Physician Assistants and Nurse Practitioners) who all work together to provide you with the care you need, when you need it.  We recommend signing up for the patient portal called "MyChart".  Sign up information is provided on this After Visit Summary.  MyChart is used to connect with patients for Virtual Visits (Telemedicine).  Patients are able to view lab/test results, encounter notes, upcoming appointments, etc.  Non-urgent messages can be sent to your provider as well.   To learn more about what you can do with MyChart, go to [email or web address].    Your next appointment:   3  month(s)  The format for your next appointment:   In Person  Provider:   Robert Krasowski, MD   Other Instructions   Echocardiogram An echocardiogram is a procedure that uses painless sound waves (ultrasound) to produce an image of the heart. Images from an echocardiogram can provide important information about:  Signs of coronary artery disease (CAD).  Aneurysm detection. An aneurysm is a weak or damaged part of an artery wall that bulges out from the normal force of blood pumping through the body.  Heart size and shape. Changes in the size or shape of the heart can be associated with certain conditions, including heart failure, aneurysm, and CAD.  Heart muscle function.  Heart valve function.  Signs of a past heart attack.  Fluid buildup around the heart.  Thickening of the heart muscle.  A tumor or infectious growth around the heart valves. Tell a health care provider about:  Any allergies you have.  All medicines you are taking, including vitamins, herbs, eye drops, creams, and over-the-counter medicines.  Any blood disorders you have.  Any surgeries you have had.  Any medical conditions you have.  Whether you are pregnant or may be pregnant. What are the risks? Generally, this is a safe procedure. However, problems may occur, including:  Allergic reaction to dye (contrast) that may be used during the procedure. What happens before the procedure? No specific preparation is needed. You may eat and drink normally. What happens during the procedure?   An IV tube may be inserted into one of your veins.  You may receive   contrast through this tube. A contrast is an injection that improves the quality of the pictures from your heart.  A gel will be applied to your chest.  A wand-like tool (transducer) will be moved over your chest. The gel will help to transmit the sound waves from the transducer.  The sound waves will harmlessly bounce off of your heart to  allow the heart images to be captured in real-time motion. The images will be recorded on a computer. The procedure may vary among health care providers and hospitals. What happens after the procedure?  You may return to your normal, everyday life, including diet, activities, and medicines, unless your health care provider tells you not to do that. Summary  An echocardiogram is a procedure that uses painless sound waves (ultrasound) to produce an image of the heart.  Images from an echocardiogram can provide important information about the size and shape of your heart, heart muscle function, heart valve function, and fluid buildup around your heart.  You do not need to do anything to prepare before this procedure. You may eat and drink normally.  After the echocardiogram is completed, you may return to your normal, everyday life, unless your health care provider tells you not to do that. This information is not intended to replace advice given to you by your health care provider. Make sure you discuss any questions you have with your health care provider. Document Revised: 11/26/2018 Document Reviewed: 09/07/2016 Elsevier Patient Education  2020 Elsevier Inc.   

## 2020-01-21 NOTE — Telephone Encounter (Signed)
Transmission received 01/13/2020.

## 2020-02-04 ENCOUNTER — Other Ambulatory Visit: Payer: Medicare HMO

## 2020-02-14 ENCOUNTER — Ambulatory Visit (INDEPENDENT_AMBULATORY_CARE_PROVIDER_SITE_OTHER): Payer: Medicare HMO | Admitting: *Deleted

## 2020-02-14 DIAGNOSIS — I639 Cerebral infarction, unspecified: Secondary | ICD-10-CM | POA: Diagnosis not present

## 2020-02-14 LAB — CUP PACEART REMOTE DEVICE CHECK
Date Time Interrogation Session: 20210628001153
Implantable Pulse Generator Implant Date: 20201021

## 2020-02-15 NOTE — Progress Notes (Signed)
Carelink Summary Report / Loop Recorder 

## 2020-03-20 ENCOUNTER — Ambulatory Visit (INDEPENDENT_AMBULATORY_CARE_PROVIDER_SITE_OTHER): Payer: Medicare HMO | Admitting: *Deleted

## 2020-03-20 DIAGNOSIS — I639 Cerebral infarction, unspecified: Secondary | ICD-10-CM | POA: Diagnosis not present

## 2020-03-21 LAB — CUP PACEART REMOTE DEVICE CHECK
Date Time Interrogation Session: 20210731001529
Implantable Pulse Generator Implant Date: 20201021

## 2020-03-22 NOTE — Progress Notes (Signed)
Carelink Summary Report / Loop Recorder 

## 2020-04-10 ENCOUNTER — Other Ambulatory Visit: Payer: Self-pay

## 2020-04-10 ENCOUNTER — Ambulatory Visit (INDEPENDENT_AMBULATORY_CARE_PROVIDER_SITE_OTHER): Payer: Medicare HMO

## 2020-04-10 DIAGNOSIS — I208 Other forms of angina pectoris: Secondary | ICD-10-CM | POA: Diagnosis not present

## 2020-04-10 LAB — ECHOCARDIOGRAM COMPLETE
Area-P 1/2: 3.91 cm2
P 1/2 time: 585 msec
S' Lateral: 3.2 cm

## 2020-04-10 NOTE — Progress Notes (Signed)
Complete echocardiogram has been performed.  Jimmy Talen Poser RDCS, RVT 

## 2020-04-20 ENCOUNTER — Ambulatory Visit (INDEPENDENT_AMBULATORY_CARE_PROVIDER_SITE_OTHER): Payer: Medicare HMO | Admitting: *Deleted

## 2020-04-20 DIAGNOSIS — I639 Cerebral infarction, unspecified: Secondary | ICD-10-CM

## 2020-04-20 LAB — CUP PACEART REMOTE DEVICE CHECK
Date Time Interrogation Session: 20210902002203
Implantable Pulse Generator Implant Date: 20201021

## 2020-04-25 NOTE — Progress Notes (Signed)
Carelink Summary Report / Loop Recorder 

## 2020-04-27 ENCOUNTER — Ambulatory Visit: Payer: Medicare HMO | Admitting: Cardiology

## 2020-06-05 ENCOUNTER — Ambulatory Visit: Payer: Medicare HMO | Admitting: Cardiology

## 2020-06-05 ENCOUNTER — Encounter: Payer: Self-pay | Admitting: Cardiology

## 2020-06-05 ENCOUNTER — Other Ambulatory Visit: Payer: Self-pay

## 2020-06-05 VITALS — BP 134/88 | HR 60 | Ht 61.0 in | Wt 190.0 lb

## 2020-06-05 DIAGNOSIS — E782 Mixed hyperlipidemia: Secondary | ICD-10-CM

## 2020-06-05 DIAGNOSIS — I208 Other forms of angina pectoris: Secondary | ICD-10-CM

## 2020-06-05 DIAGNOSIS — Z95818 Presence of other cardiac implants and grafts: Secondary | ICD-10-CM | POA: Diagnosis not present

## 2020-06-05 DIAGNOSIS — I639 Cerebral infarction, unspecified: Secondary | ICD-10-CM

## 2020-06-05 DIAGNOSIS — I10 Essential (primary) hypertension: Secondary | ICD-10-CM

## 2020-06-05 NOTE — Patient Instructions (Signed)

## 2020-06-05 NOTE — Progress Notes (Signed)
Cardiology Office Note:    Date:  06/05/2020   ID:  Wendy Valenzuela, DOB June 08, 1951, MRN [id number]  PCP:  Dema Severin, NP  Cardiologist:  Gypsy Balsam, MD    Referring MD: Dema Severin, NP   No chief complaint on file. I am doing fine, and busy working  History of Present Illness:    Wendy Valenzuela is a 69 y.o. female the past medical history significant for angina pectoris, however repeated cardiac catheterization total of 3 showing nonobstructive disease.  Thinking is that we most likely dealing with small vessel disease.  That is being managed successfully with medications.  She described to have rare episode of chest pain.  Overall she is working at [employer], she is very busy, she has to stand all day.  But she is able to manage this.  Denies have any dizziness passing out.  She is getting better with her husband passing.  He died on 11-01-2022.  Past Medical History:  Diagnosis Date  . Asthma   . Depression   . GERD (gastroesophageal reflux disease)   . Heart disease   . Hypertension   . Lung disease    Respiratory Failure  . Stroke (HCC)   . Thyroid disorder     Past Surgical History:  Procedure Laterality Date  . CARDIAC CATHETERIZATION  2016   [facility]   . DG  BONE DENSITY [facility] HX)  2007  . DIAGNOSTIC MAMMOGRAM Bilateral 2004  . INSERTION OF CARDIAC MONITOR  2020   This was done in [place]     Current Medications: Current Meds  Medication Sig  . ADVAIR DISKUS 500-50 MCG/DOSE AEPB Inhale 1 puff into the lungs 2 (two) times daily. Inhale 1 dose by mouth twice daily  . albuterol (VENTOLIN HFA) 108 (90 Base) MCG/ACT inhaler Inhale 2 puffs into the lungs every 6 (six) hours as needed for wheezing or shortness of breath.  Marland Kitchen amLODipine (NORVASC) 2.5 MG tablet Take 2.5 mg by mouth daily. Take 1 tablet once daily.  . clopidogrel (PLAVIX) 75 MG tablet Take 75 mg by mouth daily. Take 1 tablet once daily.  Marland Kitchen gabapentin (NEURONTIN) 300 MG capsule Take 300  mg by mouth daily. Take 1 tablet daily at bedtime.  . hydrochlorothiazide (HYDRODIURIL) 25 MG tablet Take 25 mg by mouth daily. Take 1 tablet once daily.   . hydrOXYzine (ATARAX/VISTARIL) 25 MG tablet Take 1 tablet by mouth daily.  . isosorbide mononitrate (IMDUR) 60 MG 24 hr tablet Take 60 mg by mouth daily.  Marland Kitchen levothyroxine (SYNTHROID) 50 MCG tablet Take 50 mcg by mouth daily. Take 1 tablet once daily.  Marland Kitchen LOSARTAN POTASSIUM PO Take 100 mg by mouth daily.  Marland Kitchen losartan-hydrochlorothiazide (HYZAAR) 100-25 MG tablet Take by mouth daily.  . montelukast (SINGULAIR) 10 MG tablet Take 10 mg by mouth daily. Take 1 tablet once daily  . nitroGLYCERIN (NITROSTAT) 0.4 MG SL tablet Place 0.4 mg under the tongue every 5 (five) minutes as needed for chest pain.  Marland Kitchen omeprazole (PRILOSEC) 40 MG capsule Take 40 mg by mouth daily. Take 1 capsule by mouth daily.  . ondansetron (ZOFRAN) 4 MG tablet Take 1 tablet by mouth as needed.  . temazepam (RESTORIL) 15 MG capsule Take 15 mg by mouth at bedtime.  . TRINTELLIX 20 MG TABS tablet Take 20 mg by mouth daily.  . vitamin B-12 (CYANOCOBALAMIN) 1000 MCG tablet Take 1,000 mcg by mouth daily.     Allergies:  Aspirin and Sulfa antibiotics   Social History   Socioeconomic History  . Marital status: Married    Spouse name: Not on file  . Number of children: Not on file  . Years of education: Not on file  . Highest education level: Not on file  Occupational History  . Not on file  Tobacco Use  . Smoking status: Never Smoker  . Smokeless tobacco: Never Used  Vaping Use  . Vaping Use: Never used  Substance and Sexual Activity  . Alcohol use: Not on file  . Drug use: Not on file  . Sexual activity: Not on file  Other Topics Concern  . Not on file  Social History Narrative  . Not on file   Social Determinants of Health   Financial Resource Strain:   . Difficulty of Paying Living Expenses: Not on file  Food Insecurity:   . Worried About Brewing technologist in the Last Year: Not on file  . Ran Out of Food in the Last Year: Not on file  Transportation Needs:   . Lack of Transportation (Medical): Not on file  . Lack of Transportation (Non-Medical): Not on file  Physical Activity:   . Days of Exercise per Week: Not on file  . Minutes of Exercise per Session: Not on file  Stress:   . Feeling of Stress : Not on file  Social Connections:   . Frequency of Communication with Friends and Family: Not on file  . Frequency of Social Gatherings with Friends and Family: Not on file  . Attends Religious Services: Not on file  . Active Member of Clubs or Organizations: Not on file  . Attends Banker Meetings: Not on file  . Marital Status: Not on file     Family History: The patient's family history includes Cancer in her mother; Hypertension in her father; Thyroid disease in her father. ROS:   Please see the history of present illness.    All 14 point review of systems negative except as described per history of present illness  EKGs/Labs/Other Studies Reviewed:      Recent Labs: No results found for requested labs within last 8760 hours.  Recent Lipid Panel No results found for: CHOL, TRIG, HDL, CHOLHDL, VLDL, LDLCALC, LDLDIRECT  Physical Exam:    VS:  BP 134/88   Pulse 60   Ht 5\' 1"  (1.549 m)   Wt 190 lb (86.2 kg)   SpO2 97%   BMI 35.90 kg/m     Wt Readings from Last 3 Encounters:  06/05/20 190 lb (86.2 kg)  01/18/20 185 lb 12.8 oz (84.3 kg)  11/22/19 183 lb (83 kg)     GEN:  Well nourished, well developed in no acute distress HEENT: Normal NECK: No JVD; No carotid bruits LYMPHATICS: No lymphadenopathy CARDIAC: RRR, no murmurs, no rubs, no gallops RESPIRATORY:  Clear to auscultation without rales, wheezing or rhonchi  ABDOMEN: Soft, non-tender, non-distended MUSCULOSKELETAL:  No edema; No deformity  SKIN: Warm and dry LOWER EXTREMITIES: no swelling NEUROLOGIC:  Alert and oriented x 3 PSYCHIATRIC:  Normal  affect   ASSESSMENT:    1. Chronic stable angina (HCC)   2. Cryptogenic stroke (HCC)   3. Essential hypertension   4. Status post placement of implantable loop recorder   5. Mixed hyperlipidemia    PLAN:    In order of problems listed above:  1. Chronic stable angina pectoris with very episode of chest pain.  Doing well from that  point review on appropriate medications. 2. History of cryptogenic stroke, interrogation of her implantable loop recorder reviewed.  There was some documentation of atrial fibrillation but it was recorded by arrow.  She did not have any atrial fibrillation. 3. Essential hypertension blood pressure well controlled continue present management. 4. Mixed dyslipidemia: I do not have recent data from her primary care physician: I will call primary care physician office to get report of her cholesterol. 5. We did talk about healthy lifestyle need to exercise on the regular basis which she does not do, but she is busy working.   Medication Adjustments/Labs and Tests Ordered: Current medicines are reviewed at length with the patient today.  Concerns regarding medicines are outlined above.  No orders of the defined types were placed in this encounter.  Medication changes: No orders of the defined types were placed in this encounter.   Signed, Georgeanna Lea, MD, [facility] 06/05/2020 9:36 AM    [facility]

## 2020-06-09 ENCOUNTER — Telehealth: Payer: Self-pay

## 2020-06-09 NOTE — Telephone Encounter (Signed)
Carelink alert received- AF no details avail.  Need manual transmission for EGMs Attempted to reach pt for manual transmission.  No answer/ no DPR on file.  Left generic message on VM requesting callback.

## 2020-06-14 NOTE — Telephone Encounter (Signed)
Attempted to call patient.   No answer, LMOVM (generic).

## 2020-06-21 NOTE — Telephone Encounter (Signed)
  Attempted to call in regards to previous note.  No answer, LMOVM.

## 2020-06-23 NOTE — Telephone Encounter (Signed)
LMOM to call DC , DC # provided.

## 2020-06-25 LAB — CUP PACEART REMOTE DEVICE CHECK
Date Time Interrogation Session: 20211107005038
Implantable Pulse Generator Implant Date: 20201021

## 2020-06-26 ENCOUNTER — Ambulatory Visit (INDEPENDENT_AMBULATORY_CARE_PROVIDER_SITE_OTHER): Payer: Medicare HMO

## 2020-06-26 DIAGNOSIS — I639 Cerebral infarction, unspecified: Secondary | ICD-10-CM

## 2020-06-26 NOTE — Progress Notes (Signed)
Carelink Summary Report / Loop Recorder 

## 2020-07-07 NOTE — Telephone Encounter (Signed)
Monthly summary received/ reviewed by MD, no changes.

## 2020-07-31 ENCOUNTER — Ambulatory Visit (INDEPENDENT_AMBULATORY_CARE_PROVIDER_SITE_OTHER): Payer: Medicare HMO

## 2020-07-31 DIAGNOSIS — I639 Cerebral infarction, unspecified: Secondary | ICD-10-CM | POA: Diagnosis not present

## 2020-08-01 LAB — CUP PACEART REMOTE DEVICE CHECK
Date Time Interrogation Session: 20211210014514
Implantable Pulse Generator Implant Date: 20201021

## 2020-08-14 NOTE — Progress Notes (Signed)
Carelink Summary Report / Loop Recorder 

## 2020-09-04 ENCOUNTER — Ambulatory Visit (INDEPENDENT_AMBULATORY_CARE_PROVIDER_SITE_OTHER): Payer: Medicare HMO

## 2020-09-04 DIAGNOSIS — I639 Cerebral infarction, unspecified: Secondary | ICD-10-CM

## 2020-09-06 LAB — CUP PACEART REMOTE DEVICE CHECK
Date Time Interrogation Session: 20220112030131
Implantable Pulse Generator Implant Date: 20201021

## 2020-09-18 NOTE — Progress Notes (Signed)
Carelink Summary Report / Loop Recorder 

## 2020-10-30 ENCOUNTER — Other Ambulatory Visit: Payer: Self-pay

## 2020-10-30 DIAGNOSIS — J984 Other disorders of lung: Secondary | ICD-10-CM | POA: Insufficient documentation

## 2020-10-30 DIAGNOSIS — J45909 Unspecified asthma, uncomplicated: Secondary | ICD-10-CM | POA: Insufficient documentation

## 2020-10-30 DIAGNOSIS — I639 Cerebral infarction, unspecified: Secondary | ICD-10-CM | POA: Insufficient documentation

## 2020-10-30 DIAGNOSIS — I519 Heart disease, unspecified: Secondary | ICD-10-CM | POA: Insufficient documentation

## 2020-10-30 DIAGNOSIS — E079 Disorder of thyroid, unspecified: Secondary | ICD-10-CM | POA: Insufficient documentation

## 2020-10-30 DIAGNOSIS — I10 Essential (primary) hypertension: Secondary | ICD-10-CM | POA: Insufficient documentation

## 2020-10-30 DIAGNOSIS — K219 Gastro-esophageal reflux disease without esophagitis: Secondary | ICD-10-CM | POA: Insufficient documentation

## 2020-10-30 DIAGNOSIS — F32A Depression, unspecified: Secondary | ICD-10-CM | POA: Insufficient documentation

## 2020-11-03 ENCOUNTER — Ambulatory Visit (INDEPENDENT_AMBULATORY_CARE_PROVIDER_SITE_OTHER): Payer: Medicare HMO | Admitting: Cardiology

## 2020-11-03 ENCOUNTER — Other Ambulatory Visit: Payer: Self-pay

## 2020-11-03 ENCOUNTER — Encounter: Payer: Self-pay | Admitting: Cardiology

## 2020-11-03 VITALS — BP 126/80 | HR 74 | Ht 61.0 in | Wt 190.0 lb

## 2020-11-03 DIAGNOSIS — N183 Chronic kidney disease, stage 3 unspecified: Secondary | ICD-10-CM

## 2020-11-03 DIAGNOSIS — I6930 Unspecified sequelae of cerebral infarction: Secondary | ICD-10-CM | POA: Diagnosis not present

## 2020-11-03 DIAGNOSIS — E1122 Type 2 diabetes mellitus with diabetic chronic kidney disease: Secondary | ICD-10-CM | POA: Diagnosis not present

## 2020-11-03 DIAGNOSIS — Z95818 Presence of other cardiac implants and grafts: Secondary | ICD-10-CM | POA: Diagnosis not present

## 2020-11-03 DIAGNOSIS — I129 Hypertensive chronic kidney disease with stage 1 through stage 4 chronic kidney disease, or unspecified chronic kidney disease: Secondary | ICD-10-CM

## 2020-11-03 DIAGNOSIS — I10 Essential (primary) hypertension: Secondary | ICD-10-CM | POA: Diagnosis not present

## 2020-11-03 DIAGNOSIS — E782 Mixed hyperlipidemia: Secondary | ICD-10-CM

## 2020-11-03 NOTE — Progress Notes (Signed)
Cardiology Office Note:    Date:  11/03/2020   ID:  Wendy Valenzuela, DOB February 03, 1951, MRN [id number]  PCP:  Dema Severin, NP  Cardiologist:  Gypsy Balsam, MD    Referring MD: Dema Severin, NP   No chief complaint on file. Cardiac wise doing fine but cannot sleep  History of Present Illness:    Wendy Valenzuela is a 70 y.o. female with cryptogenic stroke essential hypertension, dyslipidemia.  Comes today 2 months of follow-up biggest complaint she has is difficulty sleeping this is a chronic problem she says she have difficulty falling asleep she still think a lot of about things and she simply cannot relax.  She tried some melatonin some trazodone with limited success.  Denies have any palpitation chest pain tightness squeezing pressure burning chest no shortness of breath.  She said that typically during the wintertime she slows down and she does not do much but in the springtime and summer she loves gardening and she does a lot.  She is looking forward to growing season.  Past Medical History:  Diagnosis Date  . Acquired hypothyroidism 01/08/2016  . Asthma   . Carpal tunnel syndrome, bilateral upper limbs 01/08/2016  . Cervical radiculopathy 01/08/2016  . Chronic stable angina (HCC) 01/08/2016  . Cryptogenic stroke (HCC) 09/24/2019  . Depression   . Essential hypertension 07/05/2015  . GERD (gastroesophageal reflux disease)   . Heart disease   . Hypertension   . Lung disease    Respiratory Failure  . Migraine with aura and without status migrainosus 01/08/2016  . Mild intermittent asthma without complication 01/08/2016  . Mixed hyperlipidemia 01/08/2016  . NSTEMI (non-ST elevated myocardial infarction) (HCC) 04/19/2015  . OSA (obstructive sleep apnea) 01/08/2016  . Osteopenia 01/08/2016  . Primary insomnia 01/11/2016  . Restless legs syndrome (RLS) 01/11/2016  . Status post placement of implantable loop recorder 09/24/2019  . Stroke (HCC)   . Takotsubo syndrome 05/09/2015  . Thyroid  disorder   . 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  . Vitamin D deficiency disease 01/08/2016    Past Surgical History:  Procedure Laterality Date  . CARDIAC CATHETERIZATION  2016   [facility]   . DG  BONE DENSITY [facility] HX)  2007  . DIAGNOSTIC MAMMOGRAM Bilateral 2004  . INSERTION OF CARDIAC MONITOR  2020   This was done in [place]     Current Medications: No outpatient medications have been marked as taking for the 11/03/20 encounter (Appointment) with Georgeanna Lea, MD.     Allergies:   Aspirin and Sulfa antibiotics   Social History   Socioeconomic History  . Marital status: Married    Spouse name: Not on file  . Number of children: Not on file  . Years of education: Not on file  . Highest education level: Not on file  Occupational History  . Not on file  Tobacco Use  . Smoking status: Never Smoker  . Smokeless tobacco: Never Used  Vaping Use  . Vaping Use: Never used  Substance and Sexual Activity  . Alcohol use: Not on file  . Drug use: Not on file  . Sexual activity: Not on file  Other Topics Concern  . Not on file  Social History Narrative  . Not on file   Social Determinants of Health   Financial Resource Strain: Not on file  Food Insecurity: Not on file  Transportation Needs: Not on file  Physical Activity: Not on  file  Stress: Not on file  Social Connections: Not on file     Family History: The patient's family history includes Cancer in her mother; Hypertension in her father; Thyroid disease in her father. ROS:   Please see the history of present illness.    All 14 point review of systems negative except as described per history of present illness  EKGs/Labs/Other Studies Reviewed:      Recent Labs: No results found for requested labs within last 8760 hours.  Recent Lipid Panel No results found for: CHOL, TRIG, HDL, CHOLHDL, VLDL, LDLCALC, LDLDIRECT  Physical Exam:    VS:  There were no vitals taken for  this visit.    Wt Readings from Last 3 Encounters:  06/05/20 190 lb (86.2 kg)  01/18/20 185 lb 12.8 oz (84.3 kg)  11/22/19 183 lb (83 kg)     GEN:  Well nourished, well developed in no acute distress HEENT: Normal NECK: No JVD; No carotid bruits LYMPHATICS: No lymphadenopathy CARDIAC: RRR, no murmurs, no rubs, no gallops RESPIRATORY:  Clear to auscultation without rales, wheezing or rhonchi  ABDOMEN: Soft, non-tender, non-distended MUSCULOSKELETAL:  No edema; No deformity  SKIN: Warm and dry LOWER EXTREMITIES: no swelling NEUROLOGIC:  Alert and oriented x 3 PSYCHIATRIC:  Normal affect   ASSESSMENT:    1. Status post placement of implantable loop recorder   2. Essential hypertension   3. Type 2 DM with CKD stage 3 and hypertension (HCC)   4. Late effect of cerebrovascular accident (CVA)   5. Mixed hyperlipidemia    PLAN:    In order of problems listed above:  1. Status post placement of implantable loop recorder, I did review interrogation no episodes of atrial fibrillation.  She was asking a lot of question about the device she said why do I still needed I explained to her really hoping that she will never have episode of atrial fibrillation but if she does have 1 we would like to pick it up on the device and then change her management. 2. Essential hypertension blood pressure well controlled continue present management. 3. Type 2 diabetes that being followed by internal medicine team according to patient stable. 4. Mixed dyslipidemia I do not have any of her fasting lipid profile, she said primary care physician did it apparently excellent.  We will call to get report of it.   Medication Adjustments/Labs and Tests Ordered: Current medicines are reviewed at length with the patient today.  Concerns regarding medicines are outlined above.  No orders of the defined types were placed in this encounter.  Medication changes: No orders of the defined types were placed in this  encounter.   Signed, Georgeanna Lea, MD, [facility] 11/03/2020 12:47 PM    [facility]

## 2020-11-03 NOTE — Patient Instructions (Signed)

## 2020-11-12 LAB — CUP PACEART REMOTE DEVICE CHECK
Date Time Interrogation Session: 20220319043015
Implantable Pulse Generator Implant Date: 20201021

## 2020-11-13 ENCOUNTER — Ambulatory Visit (INDEPENDENT_AMBULATORY_CARE_PROVIDER_SITE_OTHER): Payer: Medicare HMO

## 2020-11-13 DIAGNOSIS — I639 Cerebral infarction, unspecified: Secondary | ICD-10-CM

## 2020-11-27 NOTE — Progress Notes (Signed)
Carelink Summary Report / Loop Recorder 

## 2021-05-10 DIAGNOSIS — R079 Chest pain, unspecified: Secondary | ICD-10-CM | POA: Diagnosis not present

## 2021-07-05 ENCOUNTER — Telehealth: Payer: Self-pay | Admitting: Cardiology

## 2021-07-05 NOTE — Telephone Encounter (Signed)
   [facility] Pre-operative Risk Assessment    Patient Name: Wendy Valenzuela  DOB: [birth year] MRN: [id number]  HEARTCARE STAFF:  - IMPORTANT!!!!!! Under Visit Info/Reason for Call, type in Other and utilize the format Clearance MM/DD/YY or Clearance TBD. Do not use dashes or single digits. - Please review there is not already an duplicate clearance open for this procedure. - If request is for dental extraction, please clarify the # of teeth to be extracted. - If the patient is currently at the dentist's office, call Pre-Op Callback Staff (MA/nurse) to input urgent request.  - If the patient is not currently in the dentist office, please route to the Pre-Op pool.  Request for surgical clearance:  What type of surgery is being performed? EGD dilation, and colonoscopy  When is this surgery scheduled? 07/10/21  What type of clearance is required (medical clearance vs. Pharmacy clearance to hold med vs. Both)? Pharmacy  Are there any medications that need to be held prior to surgery and how long? Will need to hold plavix starting today  Practice name and name of physician performing surgery? [facility], Dr. Lyda Jester  What is the office phone number? [phone]   7.   What is the office fax number? [phone]  8.   Anesthesia type (None, local, MAC, general) ? propofol   Selena Zobro 07/05/2021, 8:31 AM  _________________________________________________________________   (provider comments below)

## 2021-07-05 NOTE — Telephone Encounter (Signed)
   Primary Cardiologist: Gypsy Balsam, MD  Chart reviewed as part of pre-operative protocol coverage. Given past medical history and time since last visit, based on ACC/AHA guidelines, Wendy Valenzuela would be at acceptable risk for the planned procedure without further cardiovascular testing.   Her Plavix may be held for 5-7 days prior to her procedure if necessary.  Please resume as soon as hemostasis is achieved.  Patient was advised that if she develops new symptoms prior to surgery to contact our office to arrange a follow-up appointment.  She verbalized understanding.  I will route this recommendation to the requesting party via Epic fax function and remove from pre-op pool.  Please call with questions.  Thomasene Ripple. Coco Sharpnack NP-C    07/05/2021, 12:37 PM [facility] [address] Office [phone] Fax [phone]

## 2021-07-05 NOTE — Telephone Encounter (Signed)
Loni Muse. Lipscomb 70 year old female is requesting urgent cardiac evaluation for EGD dilation and colonoscopy.  Procedure scheduled for 07/10/2021.  She was last seen in the clinic on 11/03/2020.  During that time her main complaint was with difficulty sleeping.  She reported that she would typically slow down during the winter and increase her physical activity in the springtime and summer.  She enjoyed gardening.  She denied palpitations, chest pain, tightness, squeezing, pressure, and shortness of breath.  Her PMH includes nonobstructive CAD per cardiac catheterization (felt to be small vessel disease), asthma, depression, CAD, HTN, cryptogenic stroke, status post loop recorder implant, and thyroid disorder.  May her Plavix be held prior to her procedure?   Thank you for your help.  Please direct your response to CV DIV preop pool.  Thomasene Ripple. Heru Montz NP-C    07/05/2021, 9:09 AM [facility] [address] Office [phone] Fax [phone]

## 2021-12-07 ENCOUNTER — Encounter: Payer: Self-pay | Admitting: Cardiology

## 2021-12-07 ENCOUNTER — Ambulatory Visit: Payer: Medicare HMO | Admitting: Cardiology

## 2021-12-07 VITALS — BP 128/88 | HR 65 | Ht 61.0 in | Wt 187.4 lb

## 2021-12-07 DIAGNOSIS — I208 Other forms of angina pectoris: Secondary | ICD-10-CM

## 2021-12-07 DIAGNOSIS — E1122 Type 2 diabetes mellitus with diabetic chronic kidney disease: Secondary | ICD-10-CM

## 2021-12-07 DIAGNOSIS — I2089 Other forms of angina pectoris: Secondary | ICD-10-CM

## 2021-12-07 DIAGNOSIS — I10 Essential (primary) hypertension: Secondary | ICD-10-CM | POA: Diagnosis not present

## 2021-12-07 DIAGNOSIS — I129 Hypertensive chronic kidney disease with stage 1 through stage 4 chronic kidney disease, or unspecified chronic kidney disease: Secondary | ICD-10-CM

## 2021-12-07 DIAGNOSIS — I639 Cerebral infarction, unspecified: Secondary | ICD-10-CM | POA: Diagnosis not present

## 2021-12-07 DIAGNOSIS — N183 Chronic kidney disease, stage 3 unspecified: Secondary | ICD-10-CM

## 2021-12-07 DIAGNOSIS — G4733 Obstructive sleep apnea (adult) (pediatric): Secondary | ICD-10-CM

## 2021-12-07 DIAGNOSIS — I5181 Takotsubo syndrome: Secondary | ICD-10-CM

## 2021-12-07 MED ORDER — CLONIDINE HCL 0.1 MG PO TABS: ORAL_TABLET | ORAL | 3 refills | Status: DC

## 2021-12-07 NOTE — Addendum Note (Signed)
Addended by: Baldo Ash D on: 12/07/2021 02:08 PM ? ? Modules accepted: Orders ? ?

## 2021-12-07 NOTE — Patient Instructions (Addendum)
Medication Instructions:  ?Your physician has recommended you make the following change in your medication:  ? ?START: Catapres 0.1mg  1 daily by mouth for systolic blood pressure of 180 or greater.  ? ? ?Lab Work: ?None Ordered ?If you have labs (blood work) drawn today and your tests are completely normal, you will receive your results only by: ?MyChart Message (if you have MyChart) OR ?A paper copy in the mail ?If you have any lab test that is abnormal or we need to change your treatment, we will call you to review the results. ? ? ?Testing/Procedures: ?None Ordered ? ? ?Follow-Up: ?At [facility], you and your health needs are our priority.  As part of our continuing mission to provide you with exceptional heart care, we have created designated Provider Care Teams.  These Care Teams include your primary Cardiologist (physician) and Advanced Practice Providers (APPs -  Physician Assistants and Nurse Practitioners) who all work together to provide you with the care you need, when you need it. ? ?We recommend signing up for the patient portal called "MyChart".  Sign up information is provided on this After Visit Summary.  MyChart is used to connect with patients for Virtual Visits (Telemedicine).  Patients are able to view lab/test results, encounter notes, upcoming appointments, etc.  Non-urgent messages can be sent to your provider as well.   ?To learn more about what you can do with MyChart, go to [email or web address].   ? ?Your next appointment:   ?6 month(s) ? ?The format for your next appointment:   ?In Person ? ?Provider:   ?Jenne Campus, MD  ? ? ?Other Instructions ?NA  ?

## 2021-12-07 NOTE — Progress Notes (Signed)
?Cardiology Office Note:   ? ?Date:  12/07/2021  ? ?ID:  Tim Lair, DOB 06-24-1951, MRN [id number] ? ?PCP:  Imagene Riches, NP  ?Cardiologist:  Jenne Campus, MD   ? ?Referring MD: Imagene Riches, NP  ? ?Chief Complaint  ?Patient presents with  ? Hypertension  ? ? ?History of Present Illness:   ? ?Wendy Valenzuela is a 71 y.o. female with past medical history significant for cryptogenic CVA, essential hypertension, dyslipidemia.  She does have implantable loop recorder which did not record any episodes of atrial fibrillation.  She requested to be seen in our office since she end up being in the hospital because of high blood pressure she did not feel well check her blood pressure was [id number] systolic she eventually end up going to the emergency room she waited in the waiting room for about 3 hours decided to take double doses of her blood pressure medication since that time things subsided she was given 3 Tylenols in the emergency room and discharged home.  Since that time she seems to be doing well.  She denies have any chest pain tightness squeezing pressure burning chest no palpitations dizziness swelling of lower extremities ? ?Past Medical History:  ?Diagnosis Date  ? Acquired hypothyroidism 01/08/2016  ? Asthma   ? Carpal tunnel syndrome, bilateral upper limbs 01/08/2016  ? Cervical radiculopathy 01/08/2016  ? Chronic stable angina ([place]) 01/08/2016  ? Cryptogenic stroke ([place]) 09/24/2019  ? Depression   ? Essential hypertension 07/05/2015  ? GERD (gastroesophageal reflux disease)   ? Heart disease   ? Hypertension   ? Lung disease   ? Respiratory Failure  ? Migraine with aura and without status migrainosus 01/08/2016  ? Mild intermittent asthma without complication 99991111  ? Mixed hyperlipidemia 01/08/2016  ? NSTEMI (non-ST elevated myocardial infarction) ([place]) 04/19/2015  ? OSA (obstructive sleep apnea) 01/08/2016  ? Osteopenia 01/08/2016  ? Primary insomnia 01/11/2016  ? Restless legs syndrome (RLS) 01/11/2016  ?  Status post placement of implantable loop recorder 09/24/2019  ? Stroke [facility])   ? Takotsubo syndrome 05/09/2015  ? Thyroid disorder   ? Type 2 DM with CKD stage 3 and hypertension ([place]) 01/08/2016  ? Vitamin D deficiency disease 01/08/2016  ? ? ?Past Surgical History:  ?Procedure Laterality Date  ? CARDIAC CATHETERIZATION  2016  ? [facility]   ? DG  BONE DENSITY [facility] HX)  2007  ? DIAGNOSTIC MAMMOGRAM Bilateral 2004  ? INSERTION OF CARDIAC MONITOR  2020  ? This was done in [place]   ? ? ?Current Medications: ?Current Meds  ?Medication Sig  ? ADVAIR DISKUS 500-50 MCG/DOSE AEPB Inhale 1 puff into the lungs 2 (two) times daily. Inhale 1 dose by mouth twice daily  ? albuterol (VENTOLIN HFA) 108 (90 Base) MCG/ACT inhaler Inhale 2 puffs into the lungs every 6 (six) hours as needed for wheezing or shortness of breath.  ? amLODipine (NORVASC) 10 MG tablet Take 10 mg by mouth daily.  ? atorvastatin (LIPITOR) 20 MG tablet Take 20 mg by mouth daily.  ? clopidogrel (PLAVIX) 75 MG tablet Take 75 mg by mouth daily. Take 1 tablet once daily.  ? gabapentin (NEURONTIN) 300 MG capsule Take 300 mg by mouth 3 (three) times daily. 1 am, 1 at lunch and 3 at bedtime  ? hydrochlorothiazide (HYDRODIURIL) 25 MG tablet Take 12.5 mg by mouth daily.  ? isosorbide mononitrate (IMDUR) 60 MG 24 hr tablet Take 60 mg by mouth daily.  ?  levothyroxine (SYNTHROID) 50 MCG tablet Take 25 mcg by mouth daily. Take 1 tablet once daily.  ? losartan (COZAAR) 100 MG tablet Take 100 mg by mouth daily.  ? Melatonin 10 MG TABS Take 1-2 tablets by mouth at bedtime.  ? montelukast (SINGULAIR) 10 MG tablet Take 10 mg by mouth daily. Take 1 tablet once daily  ? nitroGLYCERIN (NITROSTAT) 0.4 MG SL tablet Place 0.4 mg under the tongue every 5 (five) minutes as needed for chest pain.  ? omeprazole (PRILOSEC) 40 MG capsule Take 40 mg by mouth daily. Take 1 capsule by mouth daily.  ? ondansetron (ZOFRAN) 4 MG tablet Take 1 tablet by mouth as needed.  ? TRINTELLIX 20 MG  TABS tablet Take 20 mg by mouth daily.  ? vitamin B-12 (CYANOCOBALAMIN) 1000 MCG tablet Take 1,000 mcg by mouth daily.  ? Vitamin D, Ergocalciferol, (DRISDOL) 1.25 MG (50000 UNIT) CAPS capsule Take 50,000 Units by mouth every 7 (seven) days.  ? [DISCONTINUED] ergocalciferol (VITAMIN D2) 1.25 MG (50000 UT) capsule Take 50,000 Units by mouth once a week.  ?  ? ?Allergies:   Aspirin and Sulfa antibiotics  ? ?Social History  ? ?Socioeconomic History  ? Marital status: Married  ?  Spouse name: Not on file  ? Number of children: Not on file  ? Years of education: Not on file  ? Highest education level: Not on file  ?Occupational History  ? Not on file  ?Tobacco Use  ? Smoking status: Never  ? Smokeless tobacco: Never  ?Vaping Use  ? Vaping Use: Never used  ?Substance and Sexual Activity  ? Alcohol use: Not on file  ? Drug use: Not on file  ? Sexual activity: Not on file  ?Other Topics Concern  ? Not on file  ?Social History Narrative  ? Not on file  ? ?Social Determinants of Health  ? ?Financial Resource Strain: Not on file  ?Food Insecurity: Not on file  ?Transportation Needs: Not on file  ?Physical Activity: Not on file  ?Stress: Not on file  ?Social Connections: Not on file  ?  ? ?Family History: ?The patient's family history includes Cancer in her mother; Hypertension in her father; Thyroid disease in her father. ?ROS:   ?Please see the history of present illness.    ?All 14 point review of systems negative except as described per history of present illness ? ?EKGs/Labs/Other Studies Reviewed:   ? ? ? ?Recent Labs: ?No results found for requested labs within last 8760 hours.  ?Recent Lipid Panel ?No results found for: CHOL, TRIG, HDL, CHOLHDL, VLDL, LDLCALC, LDLDIRECT ? ?Physical Exam:   ? ?VS:  BP 128/88 (BP Location: Left Arm, Patient Position: Sitting)   Pulse 65   Ht 5\' 1"  (1.549 m)   Wt 187 lb 6.4 oz (85 kg)   SpO2 94%   BMI 35.41 kg/m?    ? ?Wt Readings from Last 3 Encounters:  ?12/07/21 187 lb 6.4 oz (85  kg)  ?11/03/20 190 lb (86.2 kg)  ?06/05/20 190 lb (86.2 kg)  ?  ? ?GEN:  Well nourished, well developed in no acute distress ?HEENT: Normal ?NECK: No JVD; No carotid bruits ?LYMPHATICS: No lymphadenopathy ?CARDIAC: RRR, no murmurs, no rubs, no gallops ?RESPIRATORY:  Clear to auscultation without rales, wheezing or rhonchi  ?ABDOMEN: Soft, non-tender, non-distended ?MUSCULOSKELETAL:  No edema; No deformity  ?SKIN: Warm and dry ?LOWER EXTREMITIES: no swelling ?NEUROLOGIC:  Alert and oriented x 3 ?PSYCHIATRIC:  Normal affect  ? ?ASSESSMENT:   ? ?  1. Essential hypertension   ?2. Chronic stable angina (HCC)   ?3. Cryptogenic stroke ([place])   ?4. Type 2 DM with CKD stage 3 and hypertension ([place])   ?5. Takotsubo syndrome   ?6. OSA (obstructive sleep apnea)   ? ?PLAN:   ? ?In order of problems listed above: ? ?Essential hypertension blood pressure seems to be decently controlled.  Recently her primary care physician doubled the dose of amlodipine she is taking 10 mg daily as well as double the dose of Imdur.  Since that time blood pressure seems to be fine I will give her prescription for Catapres 0.1 mg to take as needed as needed for very high blood pressure. ?Chronic stable angina denies have any chest pain.  Stress test reviewed from the hospital done in September 22 showing no evidence of inducible ischemia. ?History of stroke she does have implantable loop recorder however no episode of atrial fibrillation identified.  I will send a message to our EP team time to see if device is still active if not she requested to have device removed. ?History of Takotsubo however her left ventricle ejection fraction on the stress test from September was normal ? ? ?Medication Adjustments/Labs and Tests Ordered: ?Current medicines are reviewed at length with the patient today.  Concerns regarding medicines are outlined above.  ?No orders of the defined types were placed in this encounter. ? ?Medication changes: No orders of the defined  types were placed in this encounter. ? ? ?Signed, ?Park Liter, MD, [facility] ?12/07/2021 2:00 PM    ?[place] ?

## 2022-01-22 ENCOUNTER — Encounter: Payer: Self-pay | Admitting: Cardiology

## 2022-02-25 ENCOUNTER — Encounter: Payer: Medicare HMO | Admitting: Cardiology

## 2022-03-06 ENCOUNTER — Telehealth: Payer: Self-pay | Admitting: Cardiology

## 2022-03-06 NOTE — Telephone Encounter (Signed)
   Pre-operative Risk Assessment    Patient Name: Wendy Valenzuela  DOB: [birth year] MRN: [id number]      Request for Surgical Clearance    Procedure:   Colonoscopy  Date of Surgery:  Clearance 04/11/22                                 Surgeon:  Burney Gauze, MD Surgeon's Group or Practice Name:  [facility], [place] Phone number:  [phone] Fax number:  [phone]   Type of Clearance Requested:   - Pharmacy:  Hold Clopidogrel (Plavix) is it okay to stop medication on 04/06/22   Type of Anesthesia:  Not Indicated   Additional requests/questions:   n/a  [name]   03/06/2022, 11:12 AM

## 2022-03-07 NOTE — Telephone Encounter (Signed)
Patient does not take Plavix for cardiac indication. Per chart review, she has history of stroke. Please contact appropriate provider.  Thank you Levi Aland, NP-C    03/07/2022, 3:15 PM [facility] [address] Office [phone] Fax [phone]

## 2022-03-11 ENCOUNTER — Ambulatory Visit (INDEPENDENT_AMBULATORY_CARE_PROVIDER_SITE_OTHER): Payer: Medicare HMO | Admitting: Cardiology

## 2022-03-11 ENCOUNTER — Encounter: Payer: Self-pay | Admitting: Cardiology

## 2022-03-11 VITALS — BP 112/80 | HR 76 | Ht 61.0 in | Wt 178.8 lb

## 2022-03-11 DIAGNOSIS — I639 Cerebral infarction, unspecified: Secondary | ICD-10-CM

## 2022-03-11 NOTE — Progress Notes (Signed)
Electrophysiology Office Note   Date:  03/11/2022   ID:  Wendy Valenzuela, DOB 05-01-1951, MRN [id number]  PCP:  Imagene Riches, NP  Cardiologist:  Agustin Cree Primary Electrophysiologist:  Constance Haw, MD    Chief Complaint: [name] monitor   History of Present Illness: Wendy Valenzuela is a 71 y.o. female who is being seen today for the evaluation of LINQ monitor at the request of York, Regina F, NP. Presenting today for electrophysiology evaluation.  He has a history significant hypertension and CVA.  She had multiple catheterizations that showed no evidence of coronary artery disease.  She was in [place] when she had a cryptogenic stroke.  She had a loop monitor implanted.    Today, denies symptoms of palpitations, chest pain, shortness of breath, orthopnea, PND, lower extremity edema, claudication, dizziness, presyncope, syncope, bleeding, or neurologic sequela. The patient is tolerating medications without difficulties.  He presents today for Linq monitor explant.  Past Medical History:  Diagnosis Date   Acquired hypothyroidism 01/08/2016   Asthma    Carpal tunnel syndrome, bilateral upper limbs 01/08/2016   Cervical radiculopathy 01/08/2016   Chronic stable angina ([place]) 01/08/2016   Cryptogenic stroke ([place]) 09/24/2019   Depression    Essential hypertension 07/05/2015   GERD (gastroesophageal reflux disease)    Heart disease    Hypertension    Lung disease    Respiratory Failure   Migraine with aura and without status migrainosus 01/08/2016   Mild intermittent asthma without complication 99991111   Mixed hyperlipidemia 01/08/2016   NSTEMI (non-ST elevated myocardial infarction) ([place]) 04/19/2015   OSA (obstructive sleep apnea) 01/08/2016   Osteopenia 01/08/2016   Primary insomnia 01/11/2016   Restless legs syndrome (RLS) 01/11/2016   Status post placement of implantable loop recorder 09/24/2019   Stroke ([place])    Takotsubo syndrome 05/09/2015   Thyroid disorder    Type 2 DM with CKD  stage 3 and hypertension ([place]) 01/08/2016   Vitamin D deficiency disease 01/08/2016   Past Surgical History:  Procedure Laterality Date   CARDIAC CATHETERIZATION  2016   [facility]    DG  BONE DENSITY [facility] HX)  2007   DIAGNOSTIC MAMMOGRAM Bilateral 2004   INSERTION OF CARDIAC MONITOR  2020   This was done in [place]      Current Outpatient Medications  Medication Sig Dispense Refill   ADVAIR DISKUS 500-50 MCG/DOSE AEPB Inhale 1 puff into the lungs 2 (two) times daily. Inhale 1 dose by mouth twice daily     albuterol (VENTOLIN HFA) 108 (90 Base) MCG/ACT inhaler Inhale 2 puffs into the lungs every 6 (six) hours as needed for wheezing or shortness of breath.     amLODipine (NORVASC) 10 MG tablet Take 10 mg by mouth daily.     atorvastatin (LIPITOR) 20 MG tablet Take 20 mg by mouth daily.     cloNIDine (CATAPRES) 0.1 MG tablet Take 1 tablet daily by mouth for systolic blood pressure of 180 or greater 90 tablet 3   clopidogrel (PLAVIX) 75 MG tablet Take 75 mg by mouth daily. Take 1 tablet once daily.     gabapentin (NEURONTIN) 300 MG capsule Take 300 mg by mouth 3 (three) times daily. 1 am, 1 at lunch and 3 at bedtime     hydrochlorothiazide (HYDRODIURIL) 25 MG tablet Take 12.5 mg by mouth daily.     isosorbide mononitrate (IMDUR) 60 MG 24 hr tablet Take 60 mg by mouth daily.     levothyroxine (  SYNTHROID) 50 MCG tablet Take 25 mcg by mouth daily. Take 1 tablet once daily.     losartan (COZAAR) 100 MG tablet Take 100 mg by mouth daily.     montelukast (SINGULAIR) 10 MG tablet Take 10 mg by mouth daily. Take 1 tablet once daily     nitroGLYCERIN (NITROSTAT) 0.4 MG SL tablet Place 0.4 mg under the tongue every 5 (five) minutes as needed for chest pain.     omeprazole (PRILOSEC) 40 MG capsule Take 40 mg by mouth daily. Take 1 capsule by mouth daily.     ondansetron (ZOFRAN) 4 MG tablet Take 1 tablet by mouth as needed.     TRINTELLIX 20 MG TABS tablet Take 20 mg by mouth daily.      Melatonin 10 MG TABS Take 1-2 tablets by mouth at bedtime. (Patient not taking: Reported on 03/11/2022)     vitamin B-12 (CYANOCOBALAMIN) 1000 MCG tablet Take 1,000 mcg by mouth daily. (Patient not taking: Reported on 03/11/2022)     Vitamin D, Ergocalciferol, (DRISDOL) 1.25 MG (50000 UNIT) CAPS capsule Take 50,000 Units by mouth every 7 (seven) days. (Patient not taking: Reported on 03/11/2022)     No current facility-administered medications for this visit.    Allergies:   Aspirin and Sulfa antibiotics   Social History:  The patient  reports that she has never smoked. She has never used smokeless tobacco.   Family History:  The patient's family history includes Cancer in her mother; Hypertension in her father; Thyroid disease in her father.   ROS:  Please see the history of present illness.   Otherwise, review of systems is positive for none.   All other systems are reviewed and negative.   PHYSICAL EXAM: VS:  BP 112/80   Pulse 76   Ht 5\' 1"  (1.549 m)   Wt 178 lb 12.8 oz (81.1 kg)   SpO2 96%   BMI 33.78 kg/m  , BMI Body mass index is 33.78 kg/m. GEN: Well nourished, well developed, in no acute distress  HEENT: normal  Neck: no JVD, carotid bruits, or masses Cardiac: RRR; no murmurs, rubs, or gallops,no edema  Respiratory:  clear to auscultation bilaterally, normal work of breathing GI: soft, nontender, nondistended, + BS MS: no deformity or atrophy  Skin: warm and dry, device site well healed Neuro:  Strength and sensation are intact Psych: euthymic mood, full affect  EKG:  EKG is ordered today. Personal review of the ekg ordered shows sinus rhythm  Personal review of the device interrogation today. Results in [facility]    Recent Labs: No results found for requested labs within last 365 days.    Lipid Panel  No results found for: "CHOL", "TRIG", "HDL", "CHOLHDL", "VLDL", "LDLCALC", "LDLDIRECT"   Wt Readings from Last 3 Encounters:  03/11/22 178 lb 12.8 oz (81.1 kg)   12/07/21 187 lb 6.4 oz (85 kg)  11/03/20 190 lb (86.2 kg)      Other studies Reviewed: Additional studies/ records that were reviewed today include: TTE 2021  1. Left ventricular ejection fraction, by estimation, is 60 to 65%. The  left ventricle has normal function. The left ventricle has no regional  wall motion abnormalities. Left ventricular diastolic parameters are  consistent with Grade I diastolic  dysfunction (impaired relaxation).   2. Right ventricular systolic function is normal. The right ventricular  size is normal. There is normal pulmonary artery systolic pressure.   3. The mitral valve is normal in structure. No evidence of mitral  valve  regurgitation. No evidence of mitral stenosis.   4. The aortic valve is tricuspid. Aortic valve regurgitation is mild. No  aortic stenosis is present.   5. There is mild dilatation of the ascending aorta measuring 37 mm.   6. The inferior vena cava is normal in size with greater than 50%  respiratory variability, suggesting right atrial pressure of 3 mmHg.    ASSESSMENT AND PLAN:  1.  Cryptogenic stroke: Status post Linq monitor.  Device functioning appropriately.  No atrial fibrillation noted on monitor.  At this point, she is feeling some pinching when she rolls over from the monitor.  She wishes it explanted.  Risk and benefits were discussed which include bleeding and infection.  She understands these risks and is agreed to the procedure.  2.  Hypertension: Currently well controlled   Current medicines are reviewed at length with the patient today.   The patient does not have concerns regarding her medicines.  The following changes were made today: None  Labs/ tests ordered today include:  No orders of the defined types were placed in this encounter.    Disposition:   FU with Claudis Giovanelli as needed  Signed, Apolinar Bero Jorja Loa, MD  03/11/2022 12:21 PM     [facility] [address] [place]  [place] [address] [phone] (office) [phone] (fax)  SURGEON:  Loman Brooklyn, MD     PREPROCEDURE DIAGNOSIS:  CVA    POSTPROCEDURE DIAGNOSIS:  CVA     PROCEDURES:   1. Implantable loop recorder implantation    INTRODUCTION:  SYLVAN LAHM is a 71 y.o. female with a history of unexplained stroke who presents today for implantable loop explant.  she has had a LINQ monitor.  LINQ is now at [facility] and wishes the monitor explanted.     DESCRIPTION OF PROCEDURE:  Informed written consent was obtained, and the patient was brought to the electrophysiology lab in a fasting state.  The patient required no sedation for the procedure today.  The patients left chest was therefore prepped and draped in the usual sterile fashion by the EP lab staff. The skin overlying the left parasternal region was infiltrated with lidocaine for local analgesia.  A 0.5-cm incision was made over the left parasternal region over the existing LINQ monitor.  Using a combination of sharp and blount dissection, the LINQ monitor was removed from the pocket.  Steri- Strips and a sterile dressing were then applied.  There were no early apparent complications.     CONCLUSIONS:   1. Successful explant of a [employer] Reveal LINQ implantable loop recorder for CVA  2. No early apparent complications.

## 2022-03-11 NOTE — Patient Instructions (Signed)
Medication Instructions:  Your physician recommends that you continue on your current medications as directed. Please refer to the Current Medication list given to you today.  *If you need a refill on your cardiac medications before your next appointment, please call your pharmacy*   Lab Work: None ordered   Testing/Procedures: None ordered   Follow-Up: At [facility], you and your health needs are our priority.  As part of our continuing mission to provide you with exceptional heart care, we have created designated Provider Care Teams.  These Care Teams include your primary Cardiologist (physician) and Advanced Practice Providers (APPs -  Physician Assistants and Nurse Practitioners) who all work together to provide you with the care you need, when you need it.  Your next appointment:   as  needed  The format for your next appointment:   In Person  Provider:   Loman Brooklyn, MD    Thank you for choosing [facility]!!   Dory Horn, RN [phone]  Other Instructions   Important Information About Sugar       Implantable Loop Recorder Removal, Care After This sheet gives you information about how to care for yourself after your procedure. Your health care provider may also give you more specific instructions. If you have problems or questions, contact your health care provider. What can I expect after the procedure? After the procedure, it is common to have: Soreness or discomfort near the incision. Some swelling or bruising near the incision.  Follow these instructions at home: Incision care  Monitor your cardiac device site for redness, swelling, and drainage. Call the device clinic at [phone] if you experience these symptoms or fever/chills.  Keep the large square bandage on your site for 24 hours and then you may remove it yourself. Keep the steri-strips underneath in place.   You may shower after 72 hours / 3 days from your procedure with the  steri-strips in place. They will usually fall off on their own, or may be removed after 10 days. Pat dry.   Avoid lotions, ointments, or perfumes over your incision until it is well-healed.  Please do not submerge in water until your site is completely healed.   If your wound site starts to bleed apply pressure.      If you have any questions/concerns please call the device clinic at [phone].  Activity  Return to your normal activities.  Contact a health care provider if: You have redness, swelling, or pain around your incision. You have a fever.

## 2022-03-12 NOTE — Addendum Note (Signed)
Addended by: Mariam Dollar on: 03/12/2022 09:07 AM   Modules accepted: Orders

## 2022-05-24 ENCOUNTER — Ambulatory Visit: Payer: Medicare HMO | Admitting: Cardiology

## 2022-07-17 ENCOUNTER — Ambulatory Visit: Payer: Medicare HMO | Admitting: Cardiology

## 2022-09-23 ENCOUNTER — Ambulatory Visit: Payer: Medicare HMO | Attending: Cardiology | Admitting: Cardiology

## 2022-09-23 ENCOUNTER — Encounter: Payer: Self-pay | Admitting: Cardiology

## 2022-09-23 VITALS — BP 140/70 | HR 67 | Ht 62.0 in | Wt 176.2 lb

## 2022-09-23 DIAGNOSIS — E1122 Type 2 diabetes mellitus with diabetic chronic kidney disease: Secondary | ICD-10-CM | POA: Diagnosis not present

## 2022-09-23 DIAGNOSIS — I129 Hypertensive chronic kidney disease with stage 1 through stage 4 chronic kidney disease, or unspecified chronic kidney disease: Secondary | ICD-10-CM

## 2022-09-23 DIAGNOSIS — I639 Cerebral infarction, unspecified: Secondary | ICD-10-CM

## 2022-09-23 DIAGNOSIS — N183 Chronic kidney disease, stage 3 unspecified: Secondary | ICD-10-CM

## 2022-09-23 DIAGNOSIS — I10 Essential (primary) hypertension: Secondary | ICD-10-CM | POA: Diagnosis not present

## 2022-09-23 DIAGNOSIS — E782 Mixed hyperlipidemia: Secondary | ICD-10-CM | POA: Diagnosis not present

## 2022-09-23 NOTE — Progress Notes (Signed)
Cardiology Office Note:    Date:  09/23/2022   ID:  Wendy Valenzuela, DOB [birth year], MRN [id number]  PCP:  Rhea Bleacher, NP  Cardiologist:  Jenne Campus, MD    Referring MD: Rhea Bleacher, NP   Chief Complaint  Patient presents with   Follow-up    History of Present Illness:    Wendy Valenzuela is a 72 y.o. female with past medical history significant for cryptogenic stroke, essential hypertension, dyslipidemia, she did have implantable loop recorder, however, that being removed after battery has been depleted.  Until then we did not show any significant arrhythmia that would explain her CVA. She is in my office today for follow-up.  Overall doing well.  She denies of any chest pain tightness squeezing pressure burning chest no palpitation no TIA/CVA-like symptoms.  Past Medical History:  Diagnosis Date   Acquired hypothyroidism 01/08/2016   Asthma    Carpal tunnel syndrome, bilateral upper limbs 01/08/2016   Cervical radiculopathy 01/08/2016   Chronic stable angina 01/08/2016   Cryptogenic stroke ([place]) 09/24/2019   Depression    Essential hypertension 07/05/2015   GERD (gastroesophageal reflux disease)    Heart disease    Hypertension    Lung disease    Respiratory Failure   Migraine with aura and without status migrainosus 01/08/2016   Mild intermittent asthma without complication 5/40/0867   Mixed hyperlipidemia 01/08/2016   NSTEMI (non-ST elevated myocardial infarction) ([place]) 04/19/2015   OSA (obstructive sleep apnea) 01/08/2016   Osteopenia 01/08/2016   Primary insomnia 01/11/2016   Restless legs syndrome (RLS) 01/11/2016   Status post placement of implantable loop recorder 09/24/2019   Stroke ([place])    Takotsubo syndrome 05/09/2015   Thyroid disorder    Type 2 DM with CKD stage 3 and hypertension ([place]) 01/08/2016   Vitamin D deficiency disease 01/08/2016    Past Surgical History:  Procedure Laterality Date   CARDIAC CATHETERIZATION  2016   [facility]    Cardiac  monitor removed     DG  BONE DENSITY ([place] HX)  2007   DIAGNOSTIC MAMMOGRAM Bilateral 2004   INSERTION OF CARDIAC MONITOR  2020   This was done in [place]     Current Medications: Current Meds  Medication Sig   ADVAIR DISKUS 500-50 MCG/DOSE AEPB Inhale 1 puff into the lungs 2 (two) times daily. Inhale 1 dose by mouth twice daily   albuterol (VENTOLIN HFA) 108 (90 Base) MCG/ACT inhaler Inhale 2 puffs into the lungs every 6 (six) hours as needed for wheezing or shortness of breath.   amLODipine (NORVASC) 10 MG tablet Take 10 mg by mouth daily.   atorvastatin (LIPITOR) 20 MG tablet Take 20 mg by mouth daily.   cloNIDine (CATAPRES) 0.1 MG tablet Take 1 tablet daily by mouth for systolic blood pressure of 180 or greater (Patient taking differently: Take 0.1 mg by mouth See admin instructions. Take 1 tablet daily by mouth for systolic blood pressure of 180 or greater)   clopidogrel (PLAVIX) 75 MG tablet Take 75 mg by mouth daily. Take 1 tablet once daily.   gabapentin (NEURONTIN) 300 MG capsule Take 300 mg by mouth 3 (three) times daily. 1 am, 1 at lunch and 3 at bedtime   hydrochlorothiazide (HYDRODIURIL) 25 MG tablet Take 12.5 mg by mouth daily.   isosorbide mononitrate (IMDUR) 60 MG 24 hr tablet Take 60 mg by mouth daily.   levothyroxine (SYNTHROID) 50 MCG tablet Take 25 mcg by mouth daily. Take 1  tablet once daily.   losartan (COZAAR) 100 MG tablet Take 100 mg by mouth daily.   Melatonin 10 MG TABS Take 1-2 tablets by mouth at bedtime.   montelukast (SINGULAIR) 10 MG tablet Take 10 mg by mouth daily. Take 1 tablet once daily   nitroGLYCERIN (NITROSTAT) 0.4 MG SL tablet Place 0.4 mg under the tongue every 5 (five) minutes as needed for chest pain.   omeprazole (PRILOSEC) 40 MG capsule Take 40 mg by mouth daily. Take 1 capsule by mouth daily.   ondansetron (ZOFRAN) 4 MG tablet Take 1 tablet by mouth as needed for nausea or vomiting.   TRINTELLIX 20 MG TABS tablet Take 20 mg by mouth daily.    vitamin B-12 (CYANOCOBALAMIN) 1000 MCG tablet Take 1,000 mcg by mouth daily.   Vitamin D, Ergocalciferol, (DRISDOL) 1.25 MG (50000 UNIT) CAPS capsule Take 50,000 Units by mouth every 7 (seven) days.     Allergies:   Aspirin and Sulfa antibiotics   Social History   Socioeconomic History   Marital status: Widowed    Spouse name: Not on file   Number of children: Not on file   Years of education: Not on file   Highest education level: Not on file  Occupational History   Not on file  Tobacco Use   Smoking status: Never   Smokeless tobacco: Never  Vaping Use   Vaping Use: Never used  Substance and Sexual Activity   Alcohol use: Not on file   Drug use: Not on file   Sexual activity: Not on file  Other Topics Concern   Not on file  Social History Narrative   Not on file   Social Determinants of Health   Financial Resource Strain: Not on file  Food Insecurity: Not on file  Transportation Needs: Not on file  Physical Activity: Not on file  Stress: Not on file  Social Connections: Not on file     Family History: The patient's family history includes Cancer in her mother; Hypertension in her father; Thyroid disease in her father. ROS:   Please see the history of present illness.    All 14 point review of systems negative except as described per history of present illness  EKGs/Labs/Other Studies Reviewed:      Recent Labs: No results found for requested labs within last 365 days.  Recent Lipid Panel No results found for: "CHOL", "TRIG", "HDL", "CHOLHDL", "VLDL", "LDLCALC", "LDLDIRECT"  Physical Exam:    VS:  BP (!) 140/70 (BP Location: Left Arm, Patient Position: Sitting)   Pulse 67   Ht 5\' 2"  (1.575 m)   Wt 176 lb 3.2 oz (79.9 kg)   SpO2 93%   BMI 32.23 kg/m     Wt Readings from Last 3 Encounters:  09/23/22 176 lb 3.2 oz (79.9 kg)  03/11/22 178 lb 12.8 oz (81.1 kg)  12/07/21 187 lb 6.4 oz (85 kg)     GEN:  Well nourished, well developed in no acute  distress HEENT: Normal NECK: No JVD; No carotid bruits LYMPHATICS: No lymphadenopathy CARDIAC: RRR, no murmurs, no rubs, no gallops RESPIRATORY:  Clear to auscultation without rales, wheezing or rhonchi  ABDOMEN: Soft, non-tender, non-distended MUSCULOSKELETAL:  No edema; No deformity  SKIN: Warm and dry LOWER EXTREMITIES: no swelling NEUROLOGIC:  Alert and oriented x 3 PSYCHIATRIC:  Normal affect   ASSESSMENT:    No diagnosis found. PLAN:    In order of problems listed above:  Essential hypertension: Blood pressure seems to well-controlled  continue present management. Dyslipidemia I do not have recent fasting lipid profile she is taking Lipitor 20 which I will continue.  I will call primary care physician to get a copy of her fasting lipid profile. Type 2 diabetes I do not see any recent fasting lipid profile again we will contact primary care physician to get updated copy of it. From cardiac standpoint review she is doing well.  Will continue present management.   Medication Adjustments/Labs and Tests Ordered: Current medicines are reviewed at length with the patient today.  Concerns regarding medicines are outlined above.  No orders of the defined types were placed in this encounter.  Medication changes: No orders of the defined types were placed in this encounter.   Signed, Park Liter, MD, [facility] 09/23/2022 [place]

## 2022-09-23 NOTE — Patient Instructions (Signed)

## 2022-10-18 DIAGNOSIS — G629 Polyneuropathy, unspecified: Secondary | ICD-10-CM | POA: Diagnosis not present

## 2022-10-18 DIAGNOSIS — I10 Essential (primary) hypertension: Secondary | ICD-10-CM | POA: Diagnosis not present

## 2022-10-22 DIAGNOSIS — Z1339 Encounter for screening examination for other mental health and behavioral disorders: Secondary | ICD-10-CM | POA: Diagnosis not present

## 2022-10-22 DIAGNOSIS — G2581 Restless legs syndrome: Secondary | ICD-10-CM | POA: Diagnosis not present

## 2022-10-22 DIAGNOSIS — Z139 Encounter for screening, unspecified: Secondary | ICD-10-CM | POA: Diagnosis not present

## 2022-10-22 DIAGNOSIS — I10 Essential (primary) hypertension: Secondary | ICD-10-CM | POA: Diagnosis not present

## 2022-10-22 DIAGNOSIS — Z1331 Encounter for screening for depression: Secondary | ICD-10-CM | POA: Diagnosis not present

## 2022-10-22 DIAGNOSIS — Z0189 Encounter for other specified special examinations: Secondary | ICD-10-CM | POA: Diagnosis not present

## 2022-10-22 DIAGNOSIS — G4700 Insomnia, unspecified: Secondary | ICD-10-CM | POA: Diagnosis not present

## 2022-10-22 DIAGNOSIS — Z0000 Encounter for general adult medical examination without abnormal findings: Secondary | ICD-10-CM | POA: Diagnosis not present

## 2022-10-22 DIAGNOSIS — Z1389 Encounter for screening for other disorder: Secondary | ICD-10-CM | POA: Diagnosis not present

## 2022-11-04 DIAGNOSIS — Z6831 Body mass index (BMI) 31.0-31.9, adult: Secondary | ICD-10-CM | POA: Diagnosis not present

## 2022-11-04 DIAGNOSIS — I10 Essential (primary) hypertension: Secondary | ICD-10-CM | POA: Diagnosis not present

## 2022-11-04 DIAGNOSIS — J45909 Unspecified asthma, uncomplicated: Secondary | ICD-10-CM | POA: Diagnosis not present

## 2022-11-04 DIAGNOSIS — G4700 Insomnia, unspecified: Secondary | ICD-10-CM | POA: Diagnosis not present

## 2022-11-04 DIAGNOSIS — R110 Nausea: Secondary | ICD-10-CM | POA: Diagnosis not present

## 2022-11-04 DIAGNOSIS — Z7189 Other specified counseling: Secondary | ICD-10-CM | POA: Diagnosis not present

## 2022-11-06 DIAGNOSIS — G4700 Insomnia, unspecified: Secondary | ICD-10-CM | POA: Diagnosis not present

## 2022-11-17 DIAGNOSIS — I10 Essential (primary) hypertension: Secondary | ICD-10-CM | POA: Diagnosis not present

## 2022-11-17 DIAGNOSIS — Z6831 Body mass index (BMI) 31.0-31.9, adult: Secondary | ICD-10-CM | POA: Diagnosis not present

## 2022-11-18 DIAGNOSIS — I10 Essential (primary) hypertension: Secondary | ICD-10-CM | POA: Diagnosis not present

## 2022-11-18 DIAGNOSIS — J45909 Unspecified asthma, uncomplicated: Secondary | ICD-10-CM | POA: Diagnosis not present

## 2022-11-26 DIAGNOSIS — G2581 Restless legs syndrome: Secondary | ICD-10-CM | POA: Diagnosis not present

## 2022-11-26 DIAGNOSIS — Z6830 Body mass index (BMI) 30.0-30.9, adult: Secondary | ICD-10-CM | POA: Diagnosis not present

## 2022-11-26 DIAGNOSIS — K529 Noninfective gastroenteritis and colitis, unspecified: Secondary | ICD-10-CM | POA: Diagnosis not present

## 2022-11-26 DIAGNOSIS — G4700 Insomnia, unspecified: Secondary | ICD-10-CM | POA: Diagnosis not present

## 2022-11-26 DIAGNOSIS — I10 Essential (primary) hypertension: Secondary | ICD-10-CM | POA: Diagnosis not present

## 2022-12-11 DIAGNOSIS — Z713 Dietary counseling and surveillance: Secondary | ICD-10-CM | POA: Diagnosis not present

## 2022-12-11 DIAGNOSIS — E669 Obesity, unspecified: Secondary | ICD-10-CM | POA: Diagnosis not present

## 2022-12-11 DIAGNOSIS — F339 Major depressive disorder, recurrent, unspecified: Secondary | ICD-10-CM | POA: Diagnosis not present

## 2022-12-11 DIAGNOSIS — Z6830 Body mass index (BMI) 30.0-30.9, adult: Secondary | ICD-10-CM | POA: Diagnosis not present

## 2022-12-17 DIAGNOSIS — E669 Obesity, unspecified: Secondary | ICD-10-CM | POA: Diagnosis not present

## 2022-12-17 DIAGNOSIS — I10 Essential (primary) hypertension: Secondary | ICD-10-CM | POA: Diagnosis not present

## 2022-12-18 DIAGNOSIS — I10 Essential (primary) hypertension: Secondary | ICD-10-CM | POA: Diagnosis not present

## 2022-12-18 DIAGNOSIS — E669 Obesity, unspecified: Secondary | ICD-10-CM | POA: Diagnosis not present

## 2022-12-18 DIAGNOSIS — Z713 Dietary counseling and surveillance: Secondary | ICD-10-CM | POA: Diagnosis not present

## 2022-12-18 DIAGNOSIS — Z6830 Body mass index (BMI) 30.0-30.9, adult: Secondary | ICD-10-CM | POA: Diagnosis not present

## 2022-12-18 DIAGNOSIS — F339 Major depressive disorder, recurrent, unspecified: Secondary | ICD-10-CM | POA: Diagnosis not present

## 2022-12-23 DIAGNOSIS — F339 Major depressive disorder, recurrent, unspecified: Secondary | ICD-10-CM | POA: Diagnosis not present

## 2022-12-23 DIAGNOSIS — Z6830 Body mass index (BMI) 30.0-30.9, adult: Secondary | ICD-10-CM | POA: Diagnosis not present

## 2022-12-23 DIAGNOSIS — G2581 Restless legs syndrome: Secondary | ICD-10-CM | POA: Diagnosis not present

## 2023-01-06 DIAGNOSIS — Z6829 Body mass index (BMI) 29.0-29.9, adult: Secondary | ICD-10-CM | POA: Diagnosis not present

## 2023-01-06 DIAGNOSIS — G2581 Restless legs syndrome: Secondary | ICD-10-CM | POA: Diagnosis not present

## 2023-01-06 DIAGNOSIS — I10 Essential (primary) hypertension: Secondary | ICD-10-CM | POA: Diagnosis not present

## 2023-01-17 DIAGNOSIS — E669 Obesity, unspecified: Secondary | ICD-10-CM | POA: Diagnosis not present

## 2023-01-17 DIAGNOSIS — I10 Essential (primary) hypertension: Secondary | ICD-10-CM | POA: Diagnosis not present

## 2023-01-18 DIAGNOSIS — I10 Essential (primary) hypertension: Secondary | ICD-10-CM | POA: Diagnosis not present

## 2023-01-18 DIAGNOSIS — F339 Major depressive disorder, recurrent, unspecified: Secondary | ICD-10-CM | POA: Diagnosis not present

## 2023-02-04 DIAGNOSIS — Z131 Encounter for screening for diabetes mellitus: Secondary | ICD-10-CM | POA: Diagnosis not present

## 2023-02-04 DIAGNOSIS — E039 Hypothyroidism, unspecified: Secondary | ICD-10-CM | POA: Diagnosis not present

## 2023-02-04 DIAGNOSIS — E785 Hyperlipidemia, unspecified: Secondary | ICD-10-CM | POA: Diagnosis not present

## 2023-02-10 DIAGNOSIS — I10 Essential (primary) hypertension: Secondary | ICD-10-CM | POA: Diagnosis not present

## 2023-02-10 DIAGNOSIS — N1831 Chronic kidney disease, stage 3a: Secondary | ICD-10-CM | POA: Diagnosis not present

## 2023-02-10 DIAGNOSIS — Z6830 Body mass index (BMI) 30.0-30.9, adult: Secondary | ICD-10-CM | POA: Diagnosis not present

## 2023-02-10 DIAGNOSIS — E039 Hypothyroidism, unspecified: Secondary | ICD-10-CM | POA: Diagnosis not present

## 2023-02-10 DIAGNOSIS — E785 Hyperlipidemia, unspecified: Secondary | ICD-10-CM | POA: Diagnosis not present

## 2023-02-16 DIAGNOSIS — E669 Obesity, unspecified: Secondary | ICD-10-CM | POA: Diagnosis not present

## 2023-02-16 DIAGNOSIS — I10 Essential (primary) hypertension: Secondary | ICD-10-CM | POA: Diagnosis not present

## 2023-02-17 DIAGNOSIS — E039 Hypothyroidism, unspecified: Secondary | ICD-10-CM | POA: Diagnosis not present

## 2023-02-17 DIAGNOSIS — E785 Hyperlipidemia, unspecified: Secondary | ICD-10-CM | POA: Diagnosis not present

## 2023-03-12 DIAGNOSIS — M85852 Other specified disorders of bone density and structure, left thigh: Secondary | ICD-10-CM | POA: Diagnosis not present

## 2023-03-12 DIAGNOSIS — E2839 Other primary ovarian failure: Secondary | ICD-10-CM | POA: Diagnosis not present

## 2023-03-19 DIAGNOSIS — E669 Obesity, unspecified: Secondary | ICD-10-CM | POA: Diagnosis not present

## 2023-03-19 DIAGNOSIS — I10 Essential (primary) hypertension: Secondary | ICD-10-CM | POA: Diagnosis not present

## 2023-03-20 DIAGNOSIS — E039 Hypothyroidism, unspecified: Secondary | ICD-10-CM | POA: Diagnosis not present

## 2023-03-20 DIAGNOSIS — E785 Hyperlipidemia, unspecified: Secondary | ICD-10-CM | POA: Diagnosis not present

## 2023-04-19 DIAGNOSIS — E669 Obesity, unspecified: Secondary | ICD-10-CM | POA: Diagnosis not present

## 2023-04-19 DIAGNOSIS — I10 Essential (primary) hypertension: Secondary | ICD-10-CM | POA: Diagnosis not present

## 2023-04-20 DIAGNOSIS — E039 Hypothyroidism, unspecified: Secondary | ICD-10-CM | POA: Diagnosis not present

## 2023-04-20 DIAGNOSIS — E785 Hyperlipidemia, unspecified: Secondary | ICD-10-CM | POA: Diagnosis not present

## 2023-05-19 DIAGNOSIS — I10 Essential (primary) hypertension: Secondary | ICD-10-CM | POA: Diagnosis not present

## 2023-05-19 DIAGNOSIS — E669 Obesity, unspecified: Secondary | ICD-10-CM | POA: Diagnosis not present

## 2023-05-20 DIAGNOSIS — E039 Hypothyroidism, unspecified: Secondary | ICD-10-CM | POA: Diagnosis not present

## 2023-05-20 DIAGNOSIS — E785 Hyperlipidemia, unspecified: Secondary | ICD-10-CM | POA: Diagnosis not present

## 2023-06-02 DIAGNOSIS — R7303 Prediabetes: Secondary | ICD-10-CM | POA: Diagnosis not present

## 2023-06-02 DIAGNOSIS — E785 Hyperlipidemia, unspecified: Secondary | ICD-10-CM | POA: Diagnosis not present

## 2023-06-02 DIAGNOSIS — E039 Hypothyroidism, unspecified: Secondary | ICD-10-CM | POA: Diagnosis not present

## 2023-06-10 DIAGNOSIS — N1831 Chronic kidney disease, stage 3a: Secondary | ICD-10-CM | POA: Diagnosis not present

## 2023-06-10 DIAGNOSIS — Z6830 Body mass index (BMI) 30.0-30.9, adult: Secondary | ICD-10-CM | POA: Diagnosis not present

## 2023-06-10 DIAGNOSIS — R7303 Prediabetes: Secondary | ICD-10-CM | POA: Diagnosis not present

## 2023-06-10 DIAGNOSIS — N390 Urinary tract infection, site not specified: Secondary | ICD-10-CM | POA: Diagnosis not present

## 2023-06-10 DIAGNOSIS — I10 Essential (primary) hypertension: Secondary | ICD-10-CM | POA: Diagnosis not present

## 2023-06-10 DIAGNOSIS — E785 Hyperlipidemia, unspecified: Secondary | ICD-10-CM | POA: Diagnosis not present

## 2023-06-19 DIAGNOSIS — E669 Obesity, unspecified: Secondary | ICD-10-CM | POA: Diagnosis not present

## 2023-06-19 DIAGNOSIS — I10 Essential (primary) hypertension: Secondary | ICD-10-CM | POA: Diagnosis not present

## 2023-06-20 DIAGNOSIS — E785 Hyperlipidemia, unspecified: Secondary | ICD-10-CM | POA: Diagnosis not present

## 2023-06-20 DIAGNOSIS — E039 Hypothyroidism, unspecified: Secondary | ICD-10-CM | POA: Diagnosis not present

## 2023-06-26 DIAGNOSIS — Z6830 Body mass index (BMI) 30.0-30.9, adult: Secondary | ICD-10-CM | POA: Diagnosis not present

## 2023-06-26 DIAGNOSIS — M549 Dorsalgia, unspecified: Secondary | ICD-10-CM | POA: Diagnosis not present

## 2023-07-19 DIAGNOSIS — I10 Essential (primary) hypertension: Secondary | ICD-10-CM | POA: Diagnosis not present

## 2023-07-19 DIAGNOSIS — E669 Obesity, unspecified: Secondary | ICD-10-CM | POA: Diagnosis not present

## 2023-07-20 DIAGNOSIS — E785 Hyperlipidemia, unspecified: Secondary | ICD-10-CM | POA: Diagnosis not present

## 2023-07-20 DIAGNOSIS — E039 Hypothyroidism, unspecified: Secondary | ICD-10-CM | POA: Diagnosis not present

## 2023-07-28 DIAGNOSIS — D2239 Melanocytic nevi of other parts of face: Secondary | ICD-10-CM | POA: Diagnosis not present

## 2023-07-28 DIAGNOSIS — L820 Inflamed seborrheic keratosis: Secondary | ICD-10-CM | POA: Diagnosis not present

## 2023-07-28 DIAGNOSIS — D485 Neoplasm of uncertain behavior of skin: Secondary | ICD-10-CM | POA: Diagnosis not present

## 2023-07-31 DIAGNOSIS — C44319 Basal cell carcinoma of skin of other parts of face: Secondary | ICD-10-CM | POA: Diagnosis not present

## 2023-08-19 DIAGNOSIS — E669 Obesity, unspecified: Secondary | ICD-10-CM | POA: Diagnosis not present

## 2023-08-19 DIAGNOSIS — I10 Essential (primary) hypertension: Secondary | ICD-10-CM | POA: Diagnosis not present

## 2023-08-20 DIAGNOSIS — E039 Hypothyroidism, unspecified: Secondary | ICD-10-CM | POA: Diagnosis not present

## 2023-08-20 DIAGNOSIS — E785 Hyperlipidemia, unspecified: Secondary | ICD-10-CM | POA: Diagnosis not present

## 2023-09-04 DIAGNOSIS — H5203 Hypermetropia, bilateral: Secondary | ICD-10-CM | POA: Diagnosis not present

## 2023-09-04 DIAGNOSIS — H2513 Age-related nuclear cataract, bilateral: Secondary | ICD-10-CM | POA: Diagnosis not present

## 2023-09-18 ENCOUNTER — Encounter (HOSPITAL_COMMUNITY): Payer: Self-pay

## 2023-09-18 ENCOUNTER — Ambulatory Visit: Payer: Medicare HMO | Attending: Cardiology | Admitting: Cardiology

## 2023-09-18 ENCOUNTER — Encounter: Payer: Self-pay | Admitting: Cardiology

## 2023-09-18 VITALS — BP 130/80 | HR 63 | Ht 62.0 in | Wt 169.0 lb

## 2023-09-18 DIAGNOSIS — I10 Essential (primary) hypertension: Secondary | ICD-10-CM

## 2023-09-18 DIAGNOSIS — I2089 Other forms of angina pectoris: Secondary | ICD-10-CM | POA: Diagnosis not present

## 2023-09-18 DIAGNOSIS — N183 Chronic kidney disease, stage 3 unspecified: Secondary | ICD-10-CM | POA: Diagnosis not present

## 2023-09-18 DIAGNOSIS — E1122 Type 2 diabetes mellitus with diabetic chronic kidney disease: Secondary | ICD-10-CM

## 2023-09-18 DIAGNOSIS — R079 Chest pain, unspecified: Secondary | ICD-10-CM

## 2023-09-18 DIAGNOSIS — I129 Hypertensive chronic kidney disease with stage 1 through stage 4 chronic kidney disease, or unspecified chronic kidney disease: Secondary | ICD-10-CM | POA: Diagnosis not present

## 2023-09-18 DIAGNOSIS — I6930 Unspecified sequelae of cerebral infarction: Secondary | ICD-10-CM | POA: Diagnosis not present

## 2023-09-18 NOTE — Addendum Note (Signed)
Addended by: Baldo Ash D on: 09/18/2023 11:20 AM   Modules accepted: Orders

## 2023-09-18 NOTE — Patient Instructions (Addendum)
Medication Instructions:  Your physician recommends that you continue on your current medications as directed. Please refer to the Current Medication list given to you today.  *If you need a refill on your cardiac medications before your next appointment, please call your pharmacy*   Lab Work: None Ordered If you have labs (blood work) drawn today and your tests are completely normal, you will receive your results only by: MyChart Message (if you have MyChart) OR A paper copy in the mail If you have any lab test that is abnormal or we need to change your treatment, we will call you to review the results.   Testing/Procedures: Your physician has requested that you have a lexiscan myoview. For further information please visit [email or web address] Please follow instruction sheet, as given.  The test will take approximately 3 to 4 hours to complete; you may bring reading material.  If someone comes with you to your appointment, they will need to remain in the main lobby due to limited space in the testing area.   How to prepare for your Myocardial Perfusion Test: Do not eat or drink 3 hours prior to your test, except you may have water. Do not consume products containing caffeine (regular or decaffeinated) 12 hours prior to your test. (ex: coffee, chocolate, sodas, tea). Do bring a list of your current medications with you.  If not listed below, you may take your medications as normal. Do wear comfortable clothes (no dresses or overalls) and walking shoes, tennis shoes preferred (No heels or open toe shoes are allowed). Do NOT wear cologne, perfume, aftershave, or lotions (deodorant is allowed). If these instructions are not followed, your test will have to be rescheduled.     Follow-Up: At [facility], you and your health needs are our priority.  As part of our continuing mission to provide you with exceptional heart care, we have created designated Provider Care Teams.  These Care Teams  include your primary Cardiologist (physician) and Advanced Practice Providers (APPs -  Physician Assistants and Nurse Practitioners) who all work together to provide you with the care you need, when you need it.  We recommend signing up for the patient portal called "MyChart".  Sign up information is provided on this After Visit Summary.  MyChart is used to connect with patients for Virtual Visits (Telemedicine).  Patients are able to view lab/test results, encounter notes, upcoming appointments, etc.  Non-urgent messages can be sent to your provider as well.   To learn more about what you can do with MyChart, go to [email or web address].    Your next appointment:   12 month(s)  The format for your next appointment:   In Person  Provider:   Gypsy Balsam, MD    Other Instructions NA

## 2023-09-18 NOTE — Progress Notes (Signed)
Cardiology Office Note:    Date:  09/18/2023   ID:  Wendy Valenzuela, DOB Nov 07, 1950, MRN [id number]  PCP:  Abner Greenspan, MD  Cardiologist:  Gypsy Balsam, MD    Referring MD: Erskine Emery, NP   Chief Complaint  Patient presents with   Annual Exam    History of Present Illness:    Wendy Valenzuela is a 73 y.o. female past medical history significant for cryptogenic stroke, essential hypertension, dyslipidemia she did have implantable loop recorder however it was removed after 4 years on battery has been depleted.  Comes today to months for follow-up she described to have some chest pain.  It happens about once a month she takes nitroglycerin sometimes with relief some does happen when she sits and does happen when she walks is concerned about it.  Past Medical History:  Diagnosis Date   Acquired hypothyroidism 01/08/2016   Asthma    Carpal tunnel syndrome, bilateral upper limbs 01/08/2016   Cervical radiculopathy 01/08/2016   Chronic stable angina (HCC) 01/08/2016   Cryptogenic stroke (HCC) 09/24/2019   Depression    Essential hypertension 07/05/2015   GERD (gastroesophageal reflux disease)    Heart disease    Hypertension    Lung disease    Respiratory Failure   Migraine with aura and without status migrainosus 01/08/2016   Mild intermittent asthma without complication 01/08/2016   Mixed hyperlipidemia 01/08/2016   NSTEMI (non-ST elevated myocardial infarction) (HCC) 04/19/2015   OSA (obstructive sleep apnea) 01/08/2016   Osteopenia 01/08/2016   Primary insomnia 01/11/2016   Restless legs syndrome (RLS) 01/11/2016   Status post placement of implantable loop recorder 09/24/2019   Stroke (HCC)    Takotsubo syndrome 05/09/2015   Thyroid disorder    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   Vitamin D deficiency disease 01/08/2016    Past Surgical History:  Procedure Laterality Date   CARDIAC CATHETERIZATION  2016   [facility]    Cardiac monitor removed      DG  BONE DENSITY ([facility] HX)  2007   DIAGNOSTIC MAMMOGRAM Bilateral 2004   INSERTION OF CARDIAC MONITOR  2020   This was done in [place]     Current Medications: Current Meds  Medication Sig   ADVAIR DISKUS 500-50 MCG/DOSE AEPB Inhale 1 puff into the lungs 2 (two) times daily. Inhale 1 dose by mouth twice daily   albuterol (VENTOLIN HFA) 108 (90 Base) MCG/ACT inhaler Inhale 2 puffs into the lungs every 6 (six) hours as needed for wheezing or shortness of breath.   amLODipine (NORVASC) 5 MG tablet Take 5 mg by mouth daily.   ARIPiprazole (ABILIFY) 10 MG tablet Take 10 mg by mouth daily.   ARTHRITIS PAIN RELIEVER 1 % GEL Apply 2 g topically as needed (pain).   atorvastatin (LIPITOR) 40 MG tablet Take 40 mg by mouth daily.   cloNIDine (CATAPRES) 0.1 MG tablet Take 1 tablet daily by mouth for systolic blood pressure of 180 or greater (Patient taking differently: Take 0.1 mg by mouth See admin instructions. Take 1 tablet daily by mouth for systolic blood pressure of 180 or greater)   clopidogrel (PLAVIX) 75 MG tablet Take 75 mg by mouth daily. Take 1 tablet once daily.   furosemide (LASIX) 20 MG tablet Take 20 mg by mouth daily.   gabapentin (NEURONTIN) 100 MG capsule Take 100 mg by mouth daily.   gabapentin (NEURONTIN) 300 MG capsule Take 300 mg by mouth 3 (  three) times daily. 1 am, 1 at lunch and 3 at bedtime   hydrochlorothiazide (HYDRODIURIL) 25 MG tablet Take 12.5 mg by mouth daily.   isosorbide mononitrate (IMDUR) 60 MG 24 hr tablet Take 60 mg by mouth daily.   levothyroxine (SYNTHROID) 25 MCG tablet Take 25 mcg by mouth daily before breakfast.   losartan (COZAAR) 100 MG tablet Take 100 mg by mouth daily.   Melatonin 10 MG TABS Take 1-2 tablets by mouth at bedtime.   montelukast (SINGULAIR) 10 MG tablet Take 10 mg by mouth daily. Take 1 tablet once daily   nitroGLYCERIN (NITROSTAT) 0.4 MG SL tablet Place 0.4 mg under the tongue every 5 (five) minutes as needed for chest pain.   olmesartan  (BENICAR) 40 MG tablet Take 40 mg by mouth daily.   omeprazole (PRILOSEC) 40 MG capsule Take 40 mg by mouth daily. Take 1 capsule by mouth daily.   ondansetron (ZOFRAN) 4 MG tablet Take 1 tablet by mouth as needed for nausea or vomiting.   traZODone (DESYREL) 100 MG tablet Take 200 mg by mouth at bedtime as needed.   TRINTELLIX 20 MG TABS tablet Take 20 mg by mouth daily.   vitamin B-12 (CYANOCOBALAMIN) 1000 MCG tablet Take 1,000 mcg by mouth daily.   Vitamin D, Ergocalciferol, (DRISDOL) 1.25 MG (50000 UNIT) CAPS capsule Take 50,000 Units by mouth every 7 (seven) days.   [DISCONTINUED] amLODipine (NORVASC) 10 MG tablet Take 10 mg by mouth daily.   [DISCONTINUED] atorvastatin (LIPITOR) 20 MG tablet Take 20 mg by mouth daily.   [DISCONTINUED] levothyroxine (SYNTHROID) 50 MCG tablet Take 25 mcg by mouth daily. Take 1 tablet once daily.     Allergies:   Aspirin and Sulfa antibiotics   Social History   Socioeconomic History   Marital status: Widowed    Spouse name: Not on file   Number of children: Not on file   Years of education: Not on file   Highest education level: Not on file  Occupational History   Not on file  Tobacco Use   Smoking status: Never   Smokeless tobacco: Never  Vaping Use   Vaping status: Never Used  Substance and Sexual Activity   Alcohol use: Not on file   Drug use: Not on file   Sexual activity: Not on file  Other Topics Concern   Not on file  Social History Narrative   Not on file   Social Drivers of Health   Financial Resource Strain: Not on file  Food Insecurity: Not on file  Transportation Needs: Not on file  Physical Activity: Not on file  Stress: Not on file  Social Connections: Not on file     Family History: The patient's family history includes Cancer in her mother; Hypertension in her father; Thyroid disease in her father. ROS:   Please see the history of present illness.    All 14 point review of systems negative except as described per  history of present illness  EKGs/Labs/Other Studies Reviewed:         Recent Labs: No results found for requested labs within last 365 days.  Recent Lipid Panel No results found for: "CHOL", "TRIG", "HDL", "CHOLHDL", "VLDL", "LDLCALC", "LDLDIRECT"  Physical Exam:    VS:  BP 130/80 (BP Location: Left Arm, Patient Position: Sitting, Cuff Size: Normal)   Pulse 63   Ht 5\' 2"  (1.575 m)   Wt 169 lb (76.7 kg)   SpO2 95%   BMI 30.91 kg/m  Wt Readings from Last 3 Encounters:  09/18/23 169 lb (76.7 kg)  09/23/22 176 lb 3.2 oz (79.9 kg)  03/11/22 178 lb 12.8 oz (81.1 kg)     GEN:  Well nourished, well developed in no acute distress HEENT: Normal NECK: No JVD; No carotid bruits LYMPHATICS: No lymphadenopathy CARDIAC: RRR, no murmurs, no rubs, no gallops RESPIRATORY:  Clear to auscultation without rales, wheezing or rhonchi  ABDOMEN: Soft, non-tender, non-distended MUSCULOSKELETAL:  No edema; No deformity  SKIN: Warm and dry LOWER EXTREMITIES: no swelling NEUROLOGIC:  Alert and oriented x 3 PSYCHIATRIC:  Normal affect   ASSESSMENT:    1. Essential hypertension   2. Chronic stable angina (HCC)   3. Type 2 DM with CKD stage 3 and hypertension (HCC)   4. Late effect of cerebrovascular accident (CVA)    PLAN:    In order of problems listed above:  Essential hypertension blood pressure well-controlled continue present management. Chest pain somewhat atypical but worrisome I will schedule her to have a stress test to make sure she does not have any significant inducible ischemia. Late effect CVA denies have any new problems. Type 2 diabetes followed by antimedicine team. Dyslipidemia I did review K PN which show me LDL 74 HDL 43 this is from October of last year we will continue present management   Medication Adjustments/Labs and Tests Ordered: Current medicines are reviewed at length with the patient today.  Concerns regarding medicines are outlined above.  Orders  Placed This Encounter  Procedures   EKG 12-Lead   Medication changes: No orders of the defined types were placed in this encounter.   Signed, Georgeanna Lea, MD, [facility] 09/18/2023 11:11 AM    [facility]

## 2023-09-19 DIAGNOSIS — I10 Essential (primary) hypertension: Secondary | ICD-10-CM | POA: Diagnosis not present

## 2023-09-19 DIAGNOSIS — E669 Obesity, unspecified: Secondary | ICD-10-CM | POA: Diagnosis not present

## 2023-09-20 DIAGNOSIS — E785 Hyperlipidemia, unspecified: Secondary | ICD-10-CM | POA: Diagnosis not present

## 2023-09-20 DIAGNOSIS — E039 Hypothyroidism, unspecified: Secondary | ICD-10-CM | POA: Diagnosis not present

## 2023-09-23 ENCOUNTER — Telehealth (HOSPITAL_COMMUNITY): Payer: Self-pay | Admitting: *Deleted

## 2023-09-23 NOTE — Telephone Encounter (Signed)
 Patient given detailed instructions per Myocardial Perfusion Study Information Sheet for the test on 09/24/2022 at 8:00. Patient notified to arrive 15 minutes early and that it is imperative to arrive on time for appointment to keep from having the test rescheduled.  If you need to cancel or reschedule your appointment, please call the office within 24 hours of your appointment. . Patient verbalized understanding.Wendy Valenzuela

## 2023-09-25 ENCOUNTER — Ambulatory Visit: Payer: Medicare HMO | Attending: Cardiology

## 2023-09-25 DIAGNOSIS — R079 Chest pain, unspecified: Secondary | ICD-10-CM | POA: Diagnosis not present

## 2023-09-25 MED ORDER — TECHNETIUM TC 99M TETROFOSMIN IV KIT
30.8000 | PACK | Freq: Once | INTRAVENOUS | Status: AC | PRN
  Administered 2023-09-25: 30.8 via INTRAVENOUS

## 2023-09-25 MED ORDER — TECHNETIUM TC 99M TETROFOSMIN IV KIT
10.5000 | PACK | Freq: Once | INTRAVENOUS | Status: AC | PRN
  Administered 2023-09-25: 10.5 via INTRAVENOUS

## 2023-09-25 MED ORDER — REGADENOSON 0.4 MG/5ML IV SOLN
0.4000 mg | Freq: Once | INTRAVENOUS | Status: AC
  Administered 2023-09-25: 0.4 mg via INTRAVENOUS

## 2023-09-29 DIAGNOSIS — E039 Hypothyroidism, unspecified: Secondary | ICD-10-CM | POA: Diagnosis not present

## 2023-09-29 DIAGNOSIS — R7303 Prediabetes: Secondary | ICD-10-CM | POA: Diagnosis not present

## 2023-09-29 DIAGNOSIS — E785 Hyperlipidemia, unspecified: Secondary | ICD-10-CM | POA: Diagnosis not present

## 2023-09-29 LAB — MYOCARDIAL PERFUSION IMAGING
LV dias vol: 113 mL (ref 46–106)
LV sys vol: 53 mL
Nuc Stress EF: 53 %
Peak HR: 80 {beats}/min
Rest HR: 71 {beats}/min
Rest Nuclear Isotope Dose: 10.9 mCi
SDS: 1
SRS: 6
SSS: 7
ST Depression (mm): 0 mm
Stress Nuclear Isotope Dose: 30.8 mCi
TID: 1.08

## 2023-09-30 ENCOUNTER — Encounter: Payer: Self-pay | Admitting: Cardiology

## 2023-09-30 ENCOUNTER — Telehealth: Payer: Self-pay

## 2023-09-30 NOTE — Telephone Encounter (Signed)
Left message on My Chart with normal Stress test results per Dr. Vanetta Shawl note. Routed to PCP.

## 2023-10-06 DIAGNOSIS — E785 Hyperlipidemia, unspecified: Secondary | ICD-10-CM | POA: Diagnosis not present

## 2023-10-06 DIAGNOSIS — I10 Essential (primary) hypertension: Secondary | ICD-10-CM | POA: Diagnosis not present

## 2023-10-06 DIAGNOSIS — E039 Hypothyroidism, unspecified: Secondary | ICD-10-CM | POA: Diagnosis not present

## 2023-10-06 DIAGNOSIS — R7303 Prediabetes: Secondary | ICD-10-CM | POA: Diagnosis not present

## 2023-10-06 DIAGNOSIS — Z6832 Body mass index (BMI) 32.0-32.9, adult: Secondary | ICD-10-CM | POA: Diagnosis not present

## 2023-10-17 DIAGNOSIS — E669 Obesity, unspecified: Secondary | ICD-10-CM | POA: Diagnosis not present

## 2023-10-17 DIAGNOSIS — I10 Essential (primary) hypertension: Secondary | ICD-10-CM | POA: Diagnosis not present

## 2023-10-18 DIAGNOSIS — E785 Hyperlipidemia, unspecified: Secondary | ICD-10-CM | POA: Diagnosis not present

## 2023-10-18 DIAGNOSIS — E039 Hypothyroidism, unspecified: Secondary | ICD-10-CM | POA: Diagnosis not present

## 2023-10-27 DIAGNOSIS — E669 Obesity, unspecified: Secondary | ICD-10-CM | POA: Diagnosis not present

## 2023-10-27 DIAGNOSIS — Z136 Encounter for screening for cardiovascular disorders: Secondary | ICD-10-CM | POA: Diagnosis not present

## 2023-10-27 DIAGNOSIS — Z1339 Encounter for screening examination for other mental health and behavioral disorders: Secondary | ICD-10-CM | POA: Diagnosis not present

## 2023-10-27 DIAGNOSIS — K5900 Constipation, unspecified: Secondary | ICD-10-CM | POA: Diagnosis not present

## 2023-10-27 DIAGNOSIS — Z1331 Encounter for screening for depression: Secondary | ICD-10-CM | POA: Diagnosis not present

## 2023-10-27 DIAGNOSIS — Z139 Encounter for screening, unspecified: Secondary | ICD-10-CM | POA: Diagnosis not present

## 2023-10-27 DIAGNOSIS — Z6832 Body mass index (BMI) 32.0-32.9, adult: Secondary | ICD-10-CM | POA: Diagnosis not present

## 2023-10-27 DIAGNOSIS — Z0000 Encounter for general adult medical examination without abnormal findings: Secondary | ICD-10-CM | POA: Diagnosis not present

## 2023-11-10 DIAGNOSIS — L910 Hypertrophic scar: Secondary | ICD-10-CM | POA: Diagnosis not present

## 2023-11-10 DIAGNOSIS — C44319 Basal cell carcinoma of skin of other parts of face: Secondary | ICD-10-CM | POA: Diagnosis not present

## 2023-11-10 DIAGNOSIS — L820 Inflamed seborrheic keratosis: Secondary | ICD-10-CM | POA: Diagnosis not present

## 2023-11-10 DIAGNOSIS — C44519 Basal cell carcinoma of skin of other part of trunk: Secondary | ICD-10-CM | POA: Diagnosis not present

## 2023-11-17 DIAGNOSIS — I10 Essential (primary) hypertension: Secondary | ICD-10-CM | POA: Diagnosis not present

## 2023-11-18 DIAGNOSIS — I10 Essential (primary) hypertension: Secondary | ICD-10-CM | POA: Diagnosis not present

## 2023-11-18 DIAGNOSIS — E785 Hyperlipidemia, unspecified: Secondary | ICD-10-CM | POA: Diagnosis not present

## 2023-11-21 ENCOUNTER — Other Ambulatory Visit: Payer: Self-pay | Admitting: Family Medicine

## 2023-11-21 DIAGNOSIS — Z1231 Encounter for screening mammogram for malignant neoplasm of breast: Secondary | ICD-10-CM

## 2023-11-26 ENCOUNTER — Ambulatory Visit
Admission: RE | Admit: 2023-11-26 | Discharge: 2023-11-26 | Disposition: A | Discharge status: Home / Self Care | Source: Ambulatory Visit

## 2023-11-26 DIAGNOSIS — Z1231 Encounter for screening mammogram for malignant neoplasm of breast: Secondary | ICD-10-CM

## 2023-12-17 DIAGNOSIS — I10 Essential (primary) hypertension: Secondary | ICD-10-CM | POA: Diagnosis not present

## 2023-12-18 DIAGNOSIS — I10 Essential (primary) hypertension: Secondary | ICD-10-CM | POA: Diagnosis not present

## 2023-12-18 DIAGNOSIS — E785 Hyperlipidemia, unspecified: Secondary | ICD-10-CM | POA: Diagnosis not present

## 2024-01-14 ENCOUNTER — Other Ambulatory Visit: Payer: Self-pay | Admitting: Family Medicine

## 2024-01-14 DIAGNOSIS — Z1231 Encounter for screening mammogram for malignant neoplasm of breast: Secondary | ICD-10-CM

## 2024-01-14 DIAGNOSIS — L820 Inflamed seborrheic keratosis: Secondary | ICD-10-CM | POA: Diagnosis not present

## 2024-01-14 DIAGNOSIS — D225 Melanocytic nevi of trunk: Secondary | ICD-10-CM | POA: Diagnosis not present

## 2024-01-17 DIAGNOSIS — I10 Essential (primary) hypertension: Secondary | ICD-10-CM | POA: Diagnosis not present

## 2024-01-18 DIAGNOSIS — E785 Hyperlipidemia, unspecified: Secondary | ICD-10-CM | POA: Diagnosis not present

## 2024-01-18 DIAGNOSIS — I10 Essential (primary) hypertension: Secondary | ICD-10-CM | POA: Diagnosis not present

## 2024-02-05 DIAGNOSIS — Z6832 Body mass index (BMI) 32.0-32.9, adult: Secondary | ICD-10-CM | POA: Diagnosis not present

## 2024-02-05 DIAGNOSIS — R197 Diarrhea, unspecified: Secondary | ICD-10-CM | POA: Diagnosis not present

## 2024-02-09 DIAGNOSIS — R197 Diarrhea, unspecified: Secondary | ICD-10-CM | POA: Diagnosis not present

## 2024-02-16 DIAGNOSIS — I10 Essential (primary) hypertension: Secondary | ICD-10-CM | POA: Diagnosis not present

## 2024-02-17 DIAGNOSIS — I10 Essential (primary) hypertension: Secondary | ICD-10-CM | POA: Diagnosis not present

## 2024-02-17 DIAGNOSIS — E785 Hyperlipidemia, unspecified: Secondary | ICD-10-CM | POA: Diagnosis not present

## 2024-03-01 DIAGNOSIS — E785 Hyperlipidemia, unspecified: Secondary | ICD-10-CM | POA: Diagnosis not present

## 2024-03-01 DIAGNOSIS — R7303 Prediabetes: Secondary | ICD-10-CM | POA: Diagnosis not present

## 2024-03-01 DIAGNOSIS — E039 Hypothyroidism, unspecified: Secondary | ICD-10-CM | POA: Diagnosis not present

## 2024-03-08 DIAGNOSIS — I2510 Atherosclerotic heart disease of native coronary artery without angina pectoris: Secondary | ICD-10-CM | POA: Diagnosis not present

## 2024-03-08 DIAGNOSIS — R7303 Prediabetes: Secondary | ICD-10-CM | POA: Diagnosis not present

## 2024-03-08 DIAGNOSIS — Z6833 Body mass index (BMI) 33.0-33.9, adult: Secondary | ICD-10-CM | POA: Diagnosis not present

## 2024-03-08 DIAGNOSIS — E785 Hyperlipidemia, unspecified: Secondary | ICD-10-CM | POA: Diagnosis not present

## 2024-03-08 DIAGNOSIS — E039 Hypothyroidism, unspecified: Secondary | ICD-10-CM | POA: Diagnosis not present

## 2024-03-09 ENCOUNTER — Telehealth: Payer: Self-pay | Admitting: Pharmacist

## 2024-03-09 ENCOUNTER — Telehealth: Payer: Self-pay

## 2024-03-09 DIAGNOSIS — I10 Essential (primary) hypertension: Secondary | ICD-10-CM

## 2024-03-09 NOTE — Progress Notes (Signed)
   03/09/2024  Patient ID: [name], female   DOB: 24-Nov-1950, 73 y.o.   MRN: [id number]  Contacted patient regarding referral for medication access from Trinidad Hun, MD .  Discussed patient assistance programs and process. Patient wishes to apply for Trintellix 20 mg 1 tablet PO daily  and Nurtec ODT 75 mg 1 tablet PO daily as needed for migraine. Will send to [facility] Patient Advocate to mail forms to patient and fax prescriber portion to prescriber office.   Based on patient's reported income, completed Medicare Extra Help Program application with patient via telephone. Patient made aware that a determination will be made by the Social Security Administration within 3 to 4 weeks. Patient will be notified by mail at home address used for application today.    Annabella Galla, PharmD Clinical Pharmacist [place] Direct Dial: [phone]

## 2024-03-10 ENCOUNTER — Telehealth: Payer: Self-pay

## 2024-03-10 ENCOUNTER — Other Ambulatory Visit (HOSPITAL_COMMUNITY): Payer: Self-pay

## 2024-03-10 NOTE — Telephone Encounter (Signed)
 PAP: Patient assistance application for Trintellix through [employer] has been mailed to pt's home address on file. Provider portion of application will be faxed to provider's office.   PAP: Patient assistance application for Nurtec ODT through [employer] has been mailed to pt's home address on file. Provider portion of application will be faxed to provider's office.   Provider portion of appliocation has been faxed to Dr. Landry Georgi at [facility] for review

## 2024-03-18 DIAGNOSIS — I10 Essential (primary) hypertension: Secondary | ICD-10-CM | POA: Diagnosis not present

## 2024-03-19 DIAGNOSIS — E785 Hyperlipidemia, unspecified: Secondary | ICD-10-CM | POA: Diagnosis not present

## 2024-03-19 DIAGNOSIS — I10 Essential (primary) hypertension: Secondary | ICD-10-CM | POA: Diagnosis not present

## 2024-03-22 NOTE — Telephone Encounter (Signed)
 Received both Nurtec and Trintellix provider portion of patient assistance application

## 2024-03-24 ENCOUNTER — Other Ambulatory Visit (HOSPITAL_COMMUNITY): Payer: Self-pay

## 2024-03-24 NOTE — Telephone Encounter (Signed)
 PAP: Application for Trintellix has been submitted to [employer], via fax  PAP: Application for Nurtec has been submitted to [employer], via fax

## 2024-04-02 ENCOUNTER — Other Ambulatory Visit (HOSPITAL_COMMUNITY): Payer: Self-pay

## 2024-04-02 NOTE — Telephone Encounter (Signed)
 [employer] app is out of date and needs to be resubmitted.  Will fax completed app to [employer] once patient has signed.

## 2024-04-02 NOTE — Telephone Encounter (Signed)
 PAP: Patient assistance application for Trintellix has been approved by PAP Companies: [employer] from 04/02/2024 to 08/18/2024. Medication should be delivered to PAP Delivery: Home. For further shipping updates, please contact [employer]. Patient ID is: no ID given

## 2024-04-06 ENCOUNTER — Telehealth: Payer: Self-pay

## 2024-04-06 ENCOUNTER — Telehealth: Payer: Self-pay | Admitting: Pharmacist

## 2024-04-06 NOTE — Progress Notes (Signed)
   04/06/2024  Patient ID: [name], female   DOB: Jun 23, 1951, 73 y.o.   MRN: [id number]  Telephonic engagement with Mrs. Dickey Finder today regarding patient assistance referral from Dr. Trinidad. The medication originally requested for assistance, Nurtec ODT offered through [employer] is requiring several items in order to be considered for assistance with the program. Items included but not limited to patient enrolling into the [facility] Prescription Payment Plan, attestation of inability to afford prescription costs, prior authorization from insurance (which will not authorize Nurtec ODT, plan prefers [place]) and confirmation that patient has not met their annual out of pocket costs for 2025.  Alternative plan presented to provider first offering therapeutic change and application to [employer] for [place]. Provider agreeable to plan. Patient also agreeable with change in therapy through patient assistance program for Ubrelvy 100 mg tablets. [facility] Patient Care Advocate will coordinate application with office, patient and assistance company.   Annabella Galla, PharmD Clinical Pharmacist [place] Direct Dial: [phone]

## 2024-04-06 NOTE — Telephone Encounter (Signed)
 PAP: Patient assistance application for Ubrelvy through [employer]) has been mailed to pt's home address on file. Provider portion of application will be faxed to provider's office.   Faxed provider portion of application to Dr. Landry Georgi at [facility]

## 2024-04-06 NOTE — Telephone Encounter (Signed)
Patient switched to [place].

## 2024-04-18 DIAGNOSIS — I10 Essential (primary) hypertension: Secondary | ICD-10-CM | POA: Diagnosis not present

## 2024-04-19 DIAGNOSIS — I10 Essential (primary) hypertension: Secondary | ICD-10-CM | POA: Diagnosis not present

## 2024-04-19 DIAGNOSIS — E785 Hyperlipidemia, unspecified: Secondary | ICD-10-CM | POA: Diagnosis not present

## 2024-04-22 NOTE — Telephone Encounter (Signed)
 Spoke with patient she did not receive her application for Ubrelvy patient assistance in the mail.  I put another copy in the mail and confirmed her address

## 2024-04-30 NOTE — Telephone Encounter (Signed)
 Received patient portion of patient assistance application for Ubrelvy for [employer]

## 2024-05-07 NOTE — Telephone Encounter (Signed)
 Sent proof of income along with copy of insurance card to [employer].

## 2024-05-13 NOTE — Progress Notes (Signed)
 Pharmacy Medication Assistance Program Note    05/13/2024  Patient ID: [name], female   DOB: 1950/12/07, 73 y.o.   MRN: [id number]     03/10/2024 04/06/2024  Outreach Medication One  Initial Outreach Date (Medication One) 03/10/2024 04/06/2024  Manufacturer Medication One Other Manufacturer/Drug [facility]  Other [facility]  Other Manufacturer/Drug [facility] [facility]   Type of Assistance Manufacturer Assistance Manufacturer Assistance  Date Application Sent to Patient 03/12/2024 04/09/2024  Application Items Requested Application;Proof of Income Application  Date Application Sent to Prescriber 03/10/2024 04/06/2024  Name of Prescriber Beth Trinidad Landry Trinidad  Date Application Received From Patient 03/24/2024 04/30/2024  Application Items Received From Patient Application;Proof of Income   Date Application Received From Provider 03/22/2024   Date Application Submitted to Manufacturer 03/24/2024   Patient Assistance Determination Approved Approved  Approval Start Date 03/24/2024 05/12/2024  Approval End Date 08/18/2024 08/18/2025  Patient Notification Method  Telephone Call  Telephone Call Outcome  Successful     Signature

## 2024-05-13 NOTE — Telephone Encounter (Signed)
 PAP: Patient assistance application for [employer] has been approved by PAP Companies: [employer] from 05/12/2024 to 08/18/2025. Medication should be delivered to PAP Delivery: Home. For further shipping updates, please contact [employer] at [phone]. Patient ID is: approval letter uploaded to media tab

## 2024-05-18 DIAGNOSIS — I10 Essential (primary) hypertension: Secondary | ICD-10-CM | POA: Diagnosis not present

## 2024-05-19 DIAGNOSIS — E785 Hyperlipidemia, unspecified: Secondary | ICD-10-CM | POA: Diagnosis not present

## 2024-05-19 DIAGNOSIS — I10 Essential (primary) hypertension: Secondary | ICD-10-CM | POA: Diagnosis not present

## 2024-06-14 DIAGNOSIS — J069 Acute upper respiratory infection, unspecified: Secondary | ICD-10-CM | POA: Diagnosis not present

## 2024-06-14 DIAGNOSIS — R059 Cough, unspecified: Secondary | ICD-10-CM | POA: Diagnosis not present

## 2024-06-14 DIAGNOSIS — Z20822 Contact with and (suspected) exposure to covid-19: Secondary | ICD-10-CM | POA: Diagnosis not present

## 2024-06-18 DIAGNOSIS — I10 Essential (primary) hypertension: Secondary | ICD-10-CM | POA: Diagnosis not present

## 2024-06-19 DIAGNOSIS — I10 Essential (primary) hypertension: Secondary | ICD-10-CM | POA: Diagnosis not present

## 2024-06-19 DIAGNOSIS — E785 Hyperlipidemia, unspecified: Secondary | ICD-10-CM | POA: Diagnosis not present

## 2024-07-06 DIAGNOSIS — R7303 Prediabetes: Secondary | ICD-10-CM | POA: Diagnosis not present

## 2024-07-06 DIAGNOSIS — E785 Hyperlipidemia, unspecified: Secondary | ICD-10-CM | POA: Diagnosis not present

## 2024-07-13 DIAGNOSIS — Z6833 Body mass index (BMI) 33.0-33.9, adult: Secondary | ICD-10-CM | POA: Diagnosis not present

## 2024-07-13 DIAGNOSIS — Z1231 Encounter for screening mammogram for malignant neoplasm of breast: Secondary | ICD-10-CM | POA: Diagnosis not present

## 2024-07-13 DIAGNOSIS — R7303 Prediabetes: Secondary | ICD-10-CM | POA: Diagnosis not present

## 2024-07-13 DIAGNOSIS — N1831 Chronic kidney disease, stage 3a: Secondary | ICD-10-CM | POA: Diagnosis not present

## 2024-07-13 DIAGNOSIS — E039 Hypothyroidism, unspecified: Secondary | ICD-10-CM | POA: Diagnosis not present

## 2024-07-13 DIAGNOSIS — E785 Hyperlipidemia, unspecified: Secondary | ICD-10-CM | POA: Diagnosis not present

## 2024-07-18 DIAGNOSIS — I10 Essential (primary) hypertension: Secondary | ICD-10-CM | POA: Diagnosis not present

## 2024-07-19 ENCOUNTER — Telehealth: Payer: Self-pay

## 2024-07-19 ENCOUNTER — Other Ambulatory Visit (HOSPITAL_COMMUNITY): Payer: Self-pay

## 2024-07-19 DIAGNOSIS — E785 Hyperlipidemia, unspecified: Secondary | ICD-10-CM | POA: Diagnosis not present

## 2024-07-19 DIAGNOSIS — I10 Essential (primary) hypertension: Secondary | ICD-10-CM | POA: Diagnosis not present

## 2024-07-19 NOTE — Telephone Encounter (Signed)
 PAP: Patient assistance application for [employer] through [employer] has been mailed to pt's home address on file. Provider portion of application will be faxed to provider's office.   Provider portion of application has been faxed to Dr. Landry Georgi at [facility]

## 2024-07-22 NOTE — Telephone Encounter (Signed)
 Received provider portion of patient assistance application

## 2024-08-26 NOTE — Telephone Encounter (Signed)
 Spoke with patient regarding patient assistance applications mailed to her.  Patient informed me she has decided not to proceed with PAP.

## 2024-08-30 ENCOUNTER — Ambulatory Visit: Attending: Cardiology | Admitting: Cardiology

## 2024-08-30 VITALS — BP 126/84 | HR 66 | Ht 62.0 in | Wt 176.6 lb

## 2024-08-30 DIAGNOSIS — G4733 Obstructive sleep apnea (adult) (pediatric): Secondary | ICD-10-CM

## 2024-08-30 DIAGNOSIS — I6930 Unspecified sequelae of cerebral infarction: Secondary | ICD-10-CM | POA: Diagnosis not present

## 2024-08-30 DIAGNOSIS — E782 Mixed hyperlipidemia: Secondary | ICD-10-CM | POA: Diagnosis not present

## 2024-08-30 DIAGNOSIS — I10 Essential (primary) hypertension: Secondary | ICD-10-CM | POA: Diagnosis not present

## 2024-08-30 NOTE — Progress Notes (Signed)
 " Cardiology Office Note:    Date:  08/30/2024   ID:  Wendy Valenzuela Finder, DOB 19-Nov-1950, MRN [id number]  PCP:  Wendy Hun, MD  Cardiologist:  Wendy Fitch, MD    Referring MD: Wendy Hun, MD   No chief complaint on file. Doing fine  History of Present Illness:    Wendy Valenzuela is a 74 y.o. female past medical history significant for cryptogenic stroke, essential hypertension, dyslipidemia, investigation after CVA included monitor which was unrevealing, she also have implantable loop recorder for 4 years no atrial fibrillation recorded on monitor has been removed.  Comes today to months for follow-up last time I have seen her which was at the beginning of last year year ago she was complaining of hide recent atypical chest pain, stress testing done after that showed no evidence of ischemia.  Comes today to my office doing well denies have any chest pain tightness squeezing pressure in chest, she does have a dog but does not walk the dog she goes to mailbox twice or 3 times a day have no difficulty doing it  Past Medical History:  Diagnosis Date   Acquired hypothyroidism 01/08/2016   Asthma    Carpal tunnel syndrome, bilateral upper limbs 01/08/2016   Cervical radiculopathy 01/08/2016   Chronic stable angina 01/08/2016   Cryptogenic stroke (HCC) 09/24/2019   Depression    Essential hypertension 07/05/2015   GERD (gastroesophageal reflux disease)    Heart disease    Hypertension    Lung disease    Respiratory Failure   Migraine with aura and without status migrainosus 01/08/2016   Mild intermittent asthma without complication 01/08/2016   Mixed hyperlipidemia 01/08/2016   NSTEMI (non-ST elevated myocardial infarction) (HCC) 04/19/2015   OSA (obstructive sleep apnea) 01/08/2016   Osteopenia 01/08/2016   Primary insomnia 01/11/2016   Restless legs syndrome (RLS) 01/11/2016   Status post placement of implantable loop recorder 09/24/2019   Stroke (HCC)    Takotsubo syndrome 05/09/2015    Thyroid  disorder    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   Vitamin D deficiency disease 01/08/2016    Past Surgical History:  Procedure Laterality Date   CARDIAC CATHETERIZATION  2016   [facility]    Cardiac monitor removed     DG  BONE DENSITY ([facility] HX)  2007   DIAGNOSTIC MAMMOGRAM Bilateral 2004   INSERTION OF CARDIAC MONITOR  2020   This was done in [place]      Current Medications: Active Medications[1]   Allergies:   Aspirin and Sulfa antibiotics   Social History   Socioeconomic History   Marital status: Widowed    Spouse name: Not on file   Number of children: Not on file   Years of education: Not on file   Highest education level: Not on file  Occupational History   Not on file  Tobacco Use   Smoking status: Never   Smokeless tobacco: Never  Vaping Use   Vaping status: Never Used  Substance and Sexual Activity   Alcohol  use: Not on file   Drug use: Not on file   Sexual activity: Not on file  Other Topics Concern   Not on file  Social History Narrative   Not on file   Social Drivers of Health   Tobacco Use: Low Risk (09/18/2023)   Patient History    Smoking Tobacco Use: Never    Smokeless Tobacco Use: Never    Passive Exposure: Not on file  Financial Resource Strain: Not on file  Food Insecurity: Not on file  Transportation Needs: Not on file  Physical Activity: Not on file  Stress: Not on file  Social Connections: Not on file  Depression (EYV7-0): Not on file  Alcohol  Screen: Not on file  Housing: Not on file  Utilities: Not on file  Health Literacy: Not on file     Family History: The patient's family history includes Cancer in her mother; Hypertension in her father; Thyroid  disease in her father. ROS:   Please see the history of present illness.    All 14 point review of systems negative except as described per history of present illness  EKGs/Labs/Other Studies Reviewed:    EKG  Interpretation Date/Time:  Monday August 30 2024 10:17:21 EST Ventricular Rate:  66 PR Interval:  190 QRS Duration:  122 QT Interval:  422 QTC Calculation: 442 R Axis:   -65  Text Interpretation: Normal sinus rhythm Left axis deviation Non-specific intra-ventricular conduction delay Minimal voltage criteria for LVH, may be normal variant ( Cornell product ) When compared with ECG of 16-Jun-2006 07:42, QRS duration has increased T wave inversion no longer evident in Anterolateral leads Confirmed by Bernie Charleston [phone]) on 08/30/2024 10:22:36 AM    Recent Labs: No results found for requested labs within last 365 days.  Recent Lipid Panel No results found for: CHOL, TRIG, HDL, CHOLHDL, VLDL, LDLCALC, LDLDIRECT  Physical Exam:    VS:  BP 126/84   Pulse 66   Ht 5' 2 (1.575 m)   Wt 176 lb 9.6 oz (80.1 kg)   SpO2 96%   BMI 32.30 kg/m     Wt Readings from Last 3 Encounters:  08/30/24 176 lb 9.6 oz (80.1 kg)  09/25/23 169 lb (76.7 kg)  09/18/23 169 lb (76.7 kg)     GEN:  Well nourished, well developed in no acute distress HEENT: Normal NECK: No JVD; No carotid bruits LYMPHATICS: No lymphadenopathy CARDIAC: RRR, no murmurs, no rubs, no gallops RESPIRATORY:  Clear to auscultation without rales, wheezing or rhonchi  ABDOMEN: Soft, non-tender, non-distended MUSCULOSKELETAL:  No edema; No deformity  SKIN: Warm and dry LOWER EXTREMITIES: no swelling NEUROLOGIC:  Alert and oriented x 3 PSYCHIATRIC:  Normal affect   ASSESSMENT:    1. Essential hypertension   2. Mixed hyperlipidemia   3. OSA (obstructive sleep apnea)   4. Late effect of cerebrovascular accident (CVA)    PLAN:    In order of problems listed above:  Essential hypertension blood pressure well-controlled continue present management. Dyslipidemia I did review KPN which show me LDL 71 HDL 40, will continue present management, obstructive sleep apnea followed by internal medicine team. Late  effect CVA no new issues. Overall she is doing well continue present management   Medication Adjustments/Labs and Tests Ordered: Current medicines are reviewed at length with the patient today.  Concerns regarding medicines are outlined above.  Orders Placed This Encounter  Procedures   EKG 12-Lead   Medication changes: No orders of the defined types were placed in this encounter.   Signed, [place] DOROTHA Bernie, MD, [facility] 08/30/2024 10:30 AM    [facility]    [1]  Current Meds  Medication Sig   ADVAIR DISKUS 500-50 MCG/DOSE AEPB Inhale 1 puff into the lungs 2 (two) times daily. Inhale 1 dose by mouth twice daily   albuterol (VENTOLIN HFA) 108 (90 Base) MCG/ACT inhaler Inhale 2 puffs into the lungs every 6 (six) hours as  needed for wheezing or shortness of breath.   amLODipine (NORVASC) 5 MG tablet Take 5 mg by mouth daily.   ARIPiprazole (ABILIFY) 10 MG tablet Take 10 mg by mouth daily.   atorvastatin (LIPITOR) 40 MG tablet Take 40 mg by mouth daily.   clopidogrel (PLAVIX) 75 MG tablet Take 75 mg by mouth daily. Take 1 tablet once daily.   dicyclomine (BENTYL) 20 MG tablet Take 20 mg by mouth 4 (four) times daily.   escitalopram (LEXAPRO) 10 MG tablet Take 10 mg by mouth daily.   fenofibrate 160 MG tablet Take 160 mg by mouth daily.   gabapentin (NEURONTIN) 100 MG capsule Take 100 mg by mouth daily.   gabapentin (NEURONTIN) 300 MG capsule Take 300 mg by mouth 3 (three) times daily. 1 am, 1 at lunch and 3 at bedtime   levothyroxine (SYNTHROID) 25 MCG tablet Take 25 mcg by mouth daily before breakfast.   montelukast (SINGULAIR) 10 MG tablet Take 10 mg by mouth daily. Take 1 tablet once daily   nitroGLYCERIN (NITROSTAT) 0.4 MG SL tablet Place 0.4 mg under the tongue every 5 (five) minutes as needed for chest pain.   olmesartan (BENICAR) 5 MG tablet Take 5 mg by mouth daily.   omeprazole (PRILOSEC) 40 MG capsule Take 40 mg by mouth daily. Take 1 capsule by mouth  daily.   pramipexole (MIRAPEX) 1 MG tablet Take 1 mg by mouth.   traZODone (DESYREL) 100 MG tablet Take 200 mg by mouth at bedtime as needed.   TRINTELLIX 20 MG TABS tablet Take 20 mg by mouth daily.   Ubrogepant (UBRELVY PO) Take by mouth.   [DISCONTINUED] olmesartan (BENICAR) 40 MG tablet Take 40 mg by mouth daily.   "

## 2024-08-30 NOTE — Patient Instructions (Signed)
 Medication Instructions:  Your physician recommends that you continue on your current medications as directed. Please refer to the Current Medication list given to you today.  *If you need a refill on your cardiac medications before your next appointment, please call your pharmacy*  Lab Work: None If you have labs (blood work) drawn today and your tests are completely normal, you will receive your results only by: MyChart Message (if you have MyChart) OR A paper copy in the mail If you have any lab test that is abnormal or we need to change your treatment, we will call you to review the results.  Testing/Procedures: None  Follow-Up: At [facility], you and your health needs are our priority.  As part of our continuing mission to provide you with exceptional heart care, our providers are all part of one team.  This team includes your primary Cardiologist (physician) and Advanced Practice Providers or APPs (Physician Assistants and Nurse Practitioners) who all work together to provide you with the care you need, when you need it.  Your next appointment:   1 year(s)  Provider:   Ralene Burger, MD    We recommend signing up for the patient portal called "MyChart".  Sign up information is provided on this After Visit Summary.  MyChart is used to connect with patients for Virtual Visits (Telemedicine).  Patients are able to view lab/test results, encounter notes, upcoming appointments, etc.  Non-urgent messages can be sent to your provider as well.   To learn more about what you can do with MyChart, go to [email or web address].   Other Instructions None

## 2024-08-31 ENCOUNTER — Ambulatory Visit: Admitting: Cardiology

## 2024-12-29 ENCOUNTER — Inpatient Hospital Stay: Admission: RE | Admit: 2024-12-29 | Source: Ambulatory Visit
